# Patient Record
Sex: Male | Born: 1955 | Race: White | Hispanic: No | Marital: Married | Smoking: Former smoker
Health system: Southern US, Community
[De-identification: ages and names within clinical notes are randomized; demographics above are authoritative.]

## PROBLEM LIST (undated history)

## (undated) DIAGNOSIS — I779 Disorder of arteries and arterioles, unspecified: Secondary | ICD-10-CM

## (undated) DIAGNOSIS — M1990 Unspecified osteoarthritis, unspecified site: Secondary | ICD-10-CM

## (undated) DIAGNOSIS — I10 Essential (primary) hypertension: Secondary | ICD-10-CM

## (undated) DIAGNOSIS — H9313 Tinnitus, bilateral: Secondary | ICD-10-CM

## (undated) DIAGNOSIS — M5412 Radiculopathy, cervical region: Secondary | ICD-10-CM

## (undated) DIAGNOSIS — G4733 Obstructive sleep apnea (adult) (pediatric): Secondary | ICD-10-CM

## (undated) DIAGNOSIS — I714 Abdominal aortic aneurysm, without rupture, unspecified: Secondary | ICD-10-CM

## (undated) DIAGNOSIS — I739 Peripheral vascular disease, unspecified: Secondary | ICD-10-CM

## (undated) DIAGNOSIS — G4730 Sleep apnea, unspecified: Secondary | ICD-10-CM

## (undated) DIAGNOSIS — E119 Type 2 diabetes mellitus without complications: Secondary | ICD-10-CM

## (undated) DIAGNOSIS — F32A Depression, unspecified: Secondary | ICD-10-CM

## (undated) DIAGNOSIS — K219 Gastro-esophageal reflux disease without esophagitis: Secondary | ICD-10-CM

## (undated) DIAGNOSIS — R7309 Other abnormal glucose: Secondary | ICD-10-CM

## (undated) DIAGNOSIS — G4700 Insomnia, unspecified: Secondary | ICD-10-CM

## (undated) DIAGNOSIS — I671 Cerebral aneurysm, nonruptured: Secondary | ICD-10-CM

## (undated) HISTORY — PX: SPINE SURGERY: SHX786

## (undated) HISTORY — DX: Insomnia, unspecified: G47.00

## (undated) HISTORY — PX: STENT PLACEMENT ILIAC (ARMC HX): HXRAD1735

## (undated) HISTORY — DX: Tinnitus, bilateral: H93.13

## (undated) HISTORY — DX: Peripheral vascular disease, unspecified: I73.9

## (undated) HISTORY — DX: Radiculopathy, cervical region: M54.12

## (undated) HISTORY — DX: Other abnormal glucose: R73.09

## (undated) HISTORY — PX: BACK SURGERY: SHX140

## (undated) HISTORY — DX: Cerebral aneurysm, nonruptured: I67.1

## (undated) HISTORY — DX: Depression, unspecified: F32.A

## (undated) HISTORY — DX: Disorder of arteries and arterioles, unspecified: I77.9

## (undated) HISTORY — DX: Sleep apnea, unspecified: G47.30

## (undated) HISTORY — DX: Obstructive sleep apnea (adult) (pediatric): G47.33

## (undated) HISTORY — DX: Abdominal aortic aneurysm, without rupture, unspecified: I71.40

## (undated) HISTORY — DX: Essential (primary) hypertension: I10

---

## 2020-10-06 ENCOUNTER — Other Ambulatory Visit: Payer: Self-pay

## 2020-10-06 DIAGNOSIS — I739 Peripheral vascular disease, unspecified: Secondary | ICD-10-CM

## 2020-10-07 ENCOUNTER — Encounter: Payer: Self-pay | Admitting: *Deleted

## 2020-10-23 ENCOUNTER — Ambulatory Visit (HOSPITAL_COMMUNITY): Payer: PRIVATE HEALTH INSURANCE

## 2020-11-02 ENCOUNTER — Other Ambulatory Visit: Payer: Self-pay

## 2020-11-02 ENCOUNTER — Ambulatory Visit (INDEPENDENT_AMBULATORY_CARE_PROVIDER_SITE_OTHER): Payer: PRIVATE HEALTH INSURANCE | Admitting: Vascular Surgery

## 2020-11-02 ENCOUNTER — Encounter: Payer: Self-pay | Admitting: Vascular Surgery

## 2020-11-02 VITALS — BP 144/72 | HR 80 | Temp 98.8°F | Resp 16 | Ht 74.0 in | Wt 296.0 lb

## 2020-11-02 DIAGNOSIS — I739 Peripheral vascular disease, unspecified: Secondary | ICD-10-CM | POA: Diagnosis not present

## 2020-11-02 NOTE — Progress Notes (Signed)
[facility] [place]  Patient name: Thomas Lindsey MRN: [id number] DOB: 06/17/1956 Sex: male  REASON FOR CONSULT: Establish care for peripheral vascular disease in this area  HPI: Thomas Lindsey is a 64 y.o. male, here today for establishment of vascular care.  He is here with his wife.  He recently moved here from [place].  He has prior history of small abdominal aortic aneurysm and has had follow-up with serial ultrasounds.  Also has history of peripheral vascular occlusive disease with a remote history of iliac angioplasty and stenting.  I do not have access to any of these records.  These have been requested to be sent to [place].  He is having recurrent claudication symptoms.  This is very typical calf claudication which she is able to be relieved with rest.  He also has a history of degenerative disc disease with prior back surgery.  He recently was working in an attic space and had back pain and pain radiating down his left leg.  This does sound to be much more neurogenic than peripheral vascular pain.  He has no history of tissue loss  Past Medical History:  Diagnosis Date  . Aortic disorder (HCC)   . Cervical radiculitis   . Depression   . Elevated glucose   . Hypertension   . Insomnia   . Obstructive sleep apnea   . Peripheral arterial disease (HCC)   . Saccular aneurysm   . Tinnitus of both ears     Family History  Problem Relation Age of Onset  . Heart disease Father   . Hypertension Father   . Hyperlipidemia Father   . Kidney disease Father     SOCIAL HISTORY: Social History   Socioeconomic History  . Marital status: Unknown    Spouse name: Not on file  . Number of children: Not on file  . Years of education: Not on file  . Highest education level: Not on file  Occupational History  . Not on file  Tobacco Use  . Smoking status: Former Smoker    Quit date: 06/14/2007    Years since quitting: 13.3  . Smokeless tobacco: Never Used    Substance and Sexual Activity  . Alcohol use: Yes    Comment: Occasional  . Drug use: Not Currently  . Sexual activity: Not on file  Other Topics Concern  . Not on file  Social History Narrative  . Not on file   Social Determinants of Health   Financial Resource Strain:   . Difficulty of Paying Living Expenses: Not on file  Food Insecurity:   . Worried About Programme researcher, broadcasting/film/video in the Last Year: Not on file  . Ran Out of Food in the Last Year: Not on file  Transportation Needs:   . Lack of Transportation (Medical): Not on file  . Lack of Transportation (Non-Medical): Not on file  Physical Activity:   . Days of Exercise per Week: Not on file  . Minutes of Exercise per Session: Not on file  Stress:   . Feeling of Stress : Not on file  Social Connections:   . Frequency of Communication with Friends and Family: Not on file  . Frequency of Social Gatherings with Friends and Family: Not on file  . Attends Religious Services: Not on file  . Active Member of Clubs or Organizations: Not on file  . Attends Banker Meetings: Not on file  . Marital Status: Not on  file  Intimate Partner Violence:   . Fear of Current or Ex-Partner: Not on file  . Emotionally Abused: Not on file  . Physically Abused: Not on file  . Sexually Abused: Not on file    Allergies  Allergen Reactions  . Hctz [Hydrochlorothiazide] Other (See Comments)    Dizzy    Current Outpatient Medications  Medication Sig Dispense Refill  . amLODipine (NORVASC) 10 MG tablet Take 10 mg by mouth daily.    Marland Kitchen atorvastatin (LIPITOR) 40 MG tablet Take 40 mg by mouth daily.    . clopidogrel (PLAVIX) 75 MG tablet Take 75 mg by mouth daily.    . cyclobenzaprine (FLEXERIL) 5 MG tablet Take 5 mg by mouth 2 (two) times daily as needed.    . metFORMIN (GLUCOPHAGE) 500 MG tablet Take 500 mg by mouth daily.    . metoprolol succinate (TOPROL-XL) 50 MG 24 hr tablet Take 50 mg by mouth 2 (two) times daily.    .  pantoprazole (PROTONIX) 40 MG tablet Take 40 mg by mouth daily.    Marland Kitchen spironolactone (ALDACTONE) 50 MG tablet Take 50 mg by mouth 2 (two) times daily.    . Vitamin D, Ergocalciferol, (DRISDOL) 1.25 MG (50000 UNIT) CAPS capsule Take 50,000 Units by mouth once a week.    . zolpidem (AMBIEN) 10 MG tablet Take 10 mg by mouth at bedtime as needed.     No current facility-administered medications for this visit.    REVIEW OF SYSTEMS:  [X]  denotes positive finding, [ ]  denotes negative finding Cardiac  Comments:  Chest pain or chest pressure:    Shortness of breath upon exertion: x   Short of breath when lying flat:    Irregular heart rhythm:        Vascular    Pain in calf, thigh, or hip brought on by ambulation: x   Pain in feet at night that wakes you up from your sleep:     Blood clot in your veins:    Leg swelling:         Pulmonary    Oxygen at home:    Productive cough:     Wheezing:         Neurologic    Sudden weakness in arms or legs:     Sudden numbness in arms or legs:     Sudden onset of difficulty speaking or slurred speech:    Temporary loss of vision in one eye:     Problems with dizziness:         Gastrointestinal    Blood in stool:     Vomited blood:         Genitourinary    Burning when urinating:     Blood in urine:        Psychiatric    Major depression:         Hematologic    Bleeding problems:    Problems with blood clotting too easily:        Skin    Rashes or ulcers:        Constitutional    Fever or chills:      PHYSICAL EXAM: Vitals:   11/02/20 0926  BP: (!) 144/72  Pulse: 80  Resp: 16  Temp: 98.8 F (37.1 C)  TempSrc: Other (Comment)  SpO2: 95%  Weight: 296 lb (134.3 kg)  Height: 6\' 2"  (1.88 m)    GENERAL: The patient is a well-nourished male, in no acute distress. The vital  signs are documented above. VASCULAR: Carotid arteries without bruits bilaterally.  2+ radial pulses bilaterally.  I do not palpate an aneurysm.  He does  have abdominal obesity.  He does have palpable femoral pulses and 2+ posterior tibial pulses bilaterally PULMONARY: There is good air exchange ABDOMEN: Soft and non-tender  MUSCULOSKELETAL: There are no major deformities or cyanosis. NEUROLOGIC: No focal weakness or paresthesias are detected. SKIN: There are no ulcers or rashes noted. PSYCHIATRIC: The patient has a normal affect.  DATA:   Pending  MEDICAL ISSUES:  Had long discussion with the patient.  He does report a remote history and it very Swati Granberry age of bilateral iliac stenting.  He reports that he has had stable exams in the past with noninvasive studies.  He reports that his abdominal aortic aneurysm was very small and unchanged.  I explained that he certainly does not have any evidence of limb threatening ischemia with easily palpable posterior tibial pulses.  He is quite active and is quite limited by his claudication symptoms.  I explained that in all likelihood this is related to recurrent disease in his iliac systems and there is a very high likelihood that this could be corrected with reintervention.  I would recommend this only if he is severely limited and he reveals that he is.  I have recommended that we obtain noninvasive studies with ankle arm index with a exercise study.  If this does show abnormal response with a drop in his ankle arm index, I would recommend arteriography for further evaluation and probable intervention.  We will schedule his noninvasive studies and communicate with him further following this.   Larina Earthly, MD FACS [facility] [place] Office phone [phone]

## 2020-11-04 ENCOUNTER — Other Ambulatory Visit: Payer: Self-pay | Admitting: *Deleted

## 2020-11-04 DIAGNOSIS — I739 Peripheral vascular disease, unspecified: Secondary | ICD-10-CM

## 2020-11-12 ENCOUNTER — Ambulatory Visit (HOSPITAL_COMMUNITY): Admission: RE | Admit: 2020-11-12 | Payer: PRIVATE HEALTH INSURANCE | Source: Ambulatory Visit

## 2020-11-13 ENCOUNTER — Ambulatory Visit (HOSPITAL_COMMUNITY): Admission: RE | Admit: 2020-11-13 | Payer: PRIVATE HEALTH INSURANCE | Source: Ambulatory Visit

## 2020-11-13 ENCOUNTER — Other Ambulatory Visit: Payer: Self-pay

## 2020-11-16 ENCOUNTER — Other Ambulatory Visit: Payer: Self-pay

## 2020-11-16 ENCOUNTER — Ambulatory Visit (HOSPITAL_COMMUNITY)
Admission: RE | Admit: 2020-11-16 | Discharge: 2020-11-16 | Disposition: A | Discharge status: Home / Self Care | Payer: PRIVATE HEALTH INSURANCE | Source: Ambulatory Visit | Attending: Vascular Surgery | Admitting: Vascular Surgery

## 2020-11-16 ENCOUNTER — Telehealth: Payer: Self-pay | Admitting: Vascular Surgery

## 2020-11-16 DIAGNOSIS — I739 Peripheral vascular disease, unspecified: Secondary | ICD-10-CM | POA: Insufficient documentation

## 2020-11-16 NOTE — Telephone Encounter (Signed)
I called the patient to let him know of his exercise ankle arm index.  He had normal resting studies and normal response to exercise.  I explained that this essentially rules out any arterial insufficiency as a cause for his leg symptoms.  He will follow up with his medical doctor.    I have not received records from his vascular surgeon in [place] and will check again on this.  I will see him in 1 year with ultrasound of his abdominal aortic aneurysm, carotid duplex for follow-up of moderate disease and ABI.

## 2020-11-23 ENCOUNTER — Ambulatory Visit: Payer: PRIVATE HEALTH INSURANCE | Admitting: Vascular Surgery

## 2021-05-25 ENCOUNTER — Ambulatory Visit (HOSPITAL_COMMUNITY)
Admission: RE | Admit: 2021-05-25 | Discharge: 2021-05-25 | Disposition: A | Discharge status: Home / Self Care | Payer: Medicare Other | Source: Ambulatory Visit | Attending: Adult Health | Admitting: Adult Health

## 2021-05-25 ENCOUNTER — Other Ambulatory Visit (HOSPITAL_COMMUNITY): Payer: Self-pay | Admitting: Adult Health

## 2021-05-25 DIAGNOSIS — M545 Low back pain, unspecified: Secondary | ICD-10-CM | POA: Insufficient documentation

## 2021-06-10 ENCOUNTER — Other Ambulatory Visit (HOSPITAL_COMMUNITY): Payer: Self-pay | Admitting: Orthopedic Surgery

## 2021-06-10 DIAGNOSIS — M545 Low back pain, unspecified: Secondary | ICD-10-CM

## 2021-06-17 ENCOUNTER — Other Ambulatory Visit: Payer: Self-pay

## 2021-06-17 ENCOUNTER — Ambulatory Visit (HOSPITAL_COMMUNITY)
Admission: RE | Admit: 2021-06-17 | Discharge: 2021-06-17 | Disposition: A | Discharge status: Home / Self Care | Payer: Medicare Other | Source: Ambulatory Visit | Attending: Orthopedic Surgery | Admitting: Orthopedic Surgery

## 2021-06-17 DIAGNOSIS — M545 Low back pain, unspecified: Secondary | ICD-10-CM | POA: Diagnosis present

## 2021-06-17 MED ORDER — GADOBUTROL 1 MMOL/ML IV SOLN
10.0000 mL | Freq: Once | INTRAVENOUS | Status: AC | PRN
  Administered 2021-06-17: 10 mL via INTRAVENOUS

## 2021-07-06 ENCOUNTER — Other Ambulatory Visit: Payer: Self-pay | Admitting: Orthopedic Surgery

## 2021-07-28 NOTE — Progress Notes (Signed)
Surgical Instructions    Your procedure is scheduled on Friday September 2nd.  Report to [facility] Main Entrance "A" at 5:30 A.M., then check in with the Admitting office.  Call this number if you have problems the morning of surgery:  [phone]   If you have any questions prior to your surgery date call [phone]: Open Monday-Friday 8am-4pm    Remember:  Do not eat or drink anything after midnight the night before your surgery     Take these medicines the morning of surgery with A SIP OF WATER  amLODipine (NORVASC) 10 MG tablet atorvastatin (LIPITOR) 40 MG tablet metoprolol succinate (TOPROL-XL) 50 MG 24 hr tablet pantoprazole (PROTONIX) 40 MG tablet   IF NEEDED cyclobenzaprine (FLEXERIL) 5 MG tablet  Follow your surgeon's instructions on when to stop Plavix.  If no instructions were given by your surgeon then you will need to call the office to get those instructions.    As of today, STOP taking any Aspirin (unless otherwise instructed by your surgeon) VOLTAREN, Aleve, Naproxen, Ibuprofen, Motrin, Advil, Goody's, BC's, all herbal medications, fish oil, and all vitamins.    WHAT DO I DO ABOUT MY DIABETES MEDICATION?   Do not take oral diabetes medicines metFORMIN  the morning of surgery.   HOW TO MANAGE YOUR DIABETES BEFORE AND AFTER SURGERY  Why is it important to control my blood sugar before and after surgery? Improving blood sugar levels before and after surgery helps healing and can limit problems. A way of improving blood sugar control is eating a healthy diet by:  Eating less sugar and carbohydrates  Increasing activity/exercise  Talking with your doctor about reaching your blood sugar goals High blood sugars (greater than 180 mg/dL) can raise your risk of infections and slow your recovery, so you will need to focus on controlling your diabetes during the weeks before surgery. Make sure that the doctor who takes care of your diabetes knows about your  planned surgery including the date and location.  How do I manage my blood sugar before surgery? Check your blood sugar at least 4 times a day, starting 2 days before surgery, to make sure that the level is not too high or low.  Check your blood sugar the morning of your surgery when you wake up and every 2 hours until you get to the Short Stay unit.  If your blood sugar is less than 70 mg/dL, you will need to treat for low blood sugar: Do not take insulin. Treat a low blood sugar (less than 70 mg/dL) with  cup of clear juice (cranberry or apple), 4 glucose tablets, OR glucose gel. Recheck blood sugar in 15 minutes after treatment (to make sure it is greater than 70 mg/dL). If your blood sugar is not greater than 70 mg/dL on recheck, call [phone] for further instructions. Report your blood sugar to the short stay nurse when you get to Short Stay.  If you are admitted to the hospital after surgery: Your blood sugar will be checked by the staff and you will probably be given insulin after surgery (instead of oral diabetes medicines) to make sure you have good blood sugar levels. The goal for blood sugar control after surgery is 80-180 mg/dL.         Do not wear jewelry  Do not wear lotions, powders, colognes, or deodorant. Do not shave 48 hours prior to surgery.  Men may shave face and neck. Do not bring valuables to the hospital. DO Not  wear nail polish, gel polish, artificial nails, or any other type of covering on  natural nails including finger and toenails. If patients have artificial nails, gel coating, etc. that need to be removed by a nail salon please have this removed prior to surgery or surgery may need to be canceled/delayed if the surgeon/ anesthesia feels like the patient is unable to be adequately monitored.             [facility] is not responsible for any belongings or valuables.  Do NOT Smoke (Tobacco/Vaping) or drink Alcohol 24 hours prior to your procedure If you  use a CPAP at night, you may bring all equipment for your overnight stay.   Contacts, glasses, dentures or bridgework may not be worn into surgery, please bring cases for these belongings   For patients admitted to the hospital, discharge time will be determined by your treatment team.   Patients discharged the day of surgery will not be allowed to drive home, and someone needs to stay with them for 24 hours.  ONLY 1 SUPPORT PERSON MAY BE PRESENT WHILE YOU ARE IN SURGERY. IF YOU ARE TO BE ADMITTED ONCE YOU ARE IN YOUR ROOM YOU WILL BE ALLOWED TWO (2) VISITORS.  Minor children may have two parents present. Special consideration for safety and communication needs will be reviewed on a case by case basis.  Special instructions:    Oral Hygiene is also important to reduce your risk of infection.  Remember - BRUSH YOUR TEETH THE MORNING OF SURGERY WITH YOUR REGULAR TOOTHPASTE   [facility]- Preparing For Surgery  Before surgery, you can play an important role. Because skin is not sterile, your skin needs to be as free of germs as possible. You can reduce the number of germs on your skin by washing with CHG (chlorahexidine gluconate) Soap before surgery.  CHG is an antiseptic cleaner which kills germs and bonds with the skin to continue killing germs even after washing.     Please do not use if you have an allergy to CHG or antibacterial soaps. If your skin becomes reddened/irritated stop using the CHG.  Do not shave (including legs and underarms) for at least 48 hours prior to first CHG shower. It is OK to shave your face.  Please follow these instructions carefully.     Shower the NIGHT BEFORE SURGERY and the MORNING OF SURGERY with CHG Soap.   If you chose to wash your hair, wash your hair first as usual with your normal shampoo. After you shampoo, rinse your hair and body thoroughly to remove the shampoo.  Then [employer] and genitals (private parts) with your normal soap and rinse thoroughly  to remove soap.  After that Use CHG Soap as you would any other liquid soap. You can apply CHG directly to the skin and wash gently with a scrungie or a clean washcloth.   Apply the CHG Soap to your body ONLY FROM THE NECK DOWN.  Do not use on open wounds or open sores. Avoid contact with your eyes, ears, mouth and genitals (private parts). Wash Face and genitals (private parts)  with your normal soap.   Wash thoroughly, paying special attention to the area where your surgery will be performed.  Thoroughly rinse your body with warm water from the neck down.  DO NOT shower/wash with your normal soap after using and rinsing off the CHG Soap.  Pat yourself dry with a CLEAN TOWEL.  Wear CLEAN PAJAMAS to bed  the night before surgery  Place CLEAN SHEETS on your bed the night before your surgery  DO NOT SLEEP WITH PETS.   Day of Surgery:  Take a shower with CHG soap. Wear Clean/Comfortable clothing the morning of surgery Do not apply any deodorants/lotions.   Remember to brush your teeth WITH YOUR REGULAR TOOTHPASTE.   Please read over the following fact sheets that you were given.

## 2021-07-29 ENCOUNTER — Encounter (HOSPITAL_COMMUNITY)
Admission: RE | Admit: 2021-07-29 | Discharge: 2021-07-29 | Disposition: A | Discharge status: Home / Self Care | Payer: Medicare Other | Source: Ambulatory Visit | Attending: Orthopedic Surgery | Admitting: Orthopedic Surgery

## 2021-07-29 ENCOUNTER — Other Ambulatory Visit: Payer: Self-pay

## 2021-07-29 ENCOUNTER — Encounter (HOSPITAL_COMMUNITY): Payer: Self-pay

## 2021-07-29 DIAGNOSIS — Z01812 Encounter for preprocedural laboratory examination: Secondary | ICD-10-CM | POA: Insufficient documentation

## 2021-07-29 DIAGNOSIS — M5416 Radiculopathy, lumbar region: Secondary | ICD-10-CM | POA: Diagnosis not present

## 2021-07-29 HISTORY — DX: Type 2 diabetes mellitus without complications: E11.9

## 2021-07-29 HISTORY — DX: Gastro-esophageal reflux disease without esophagitis: K21.9

## 2021-07-29 HISTORY — DX: Unspecified osteoarthritis, unspecified site: M19.90

## 2021-07-29 LAB — URINALYSIS, ROUTINE W REFLEX MICROSCOPIC
Bilirubin Urine: NEGATIVE
Glucose, UA: NEGATIVE mg/dL
Hgb urine dipstick: NEGATIVE
Ketones, ur: NEGATIVE mg/dL
Leukocytes,Ua: NEGATIVE
Nitrite: NEGATIVE
Protein, ur: NEGATIVE mg/dL
Specific Gravity, Urine: 1.018 (ref 1.005–1.030)
pH: 5 (ref 5.0–8.0)

## 2021-07-29 LAB — CBC WITH DIFFERENTIAL/PLATELET
Abs Immature Granulocytes: 0.05 10*3/uL (ref 0.00–0.07)
Basophils Absolute: 0 10*3/uL (ref 0.0–0.1)
Basophils Relative: 1 %
Eosinophils Absolute: 0.1 10*3/uL (ref 0.0–0.5)
Eosinophils Relative: 1 %
HCT: 41.2 % (ref 39.0–52.0)
Hemoglobin: 13.8 g/dL (ref 13.0–17.0)
Immature Granulocytes: 1 %
Lymphocytes Relative: 31 %
Lymphs Abs: 2.6 10*3/uL (ref 0.7–4.0)
MCH: 30.7 pg (ref 26.0–34.0)
MCHC: 33.5 g/dL (ref 30.0–36.0)
MCV: 91.6 fL (ref 80.0–100.0)
Monocytes Absolute: 0.5 10*3/uL (ref 0.1–1.0)
Monocytes Relative: 6 %
Neutro Abs: 5 10*3/uL (ref 1.7–7.7)
Neutrophils Relative %: 60 %
Platelets: 326 10*3/uL (ref 150–400)
RBC: 4.5 MIL/uL (ref 4.22–5.81)
RDW: 13 % (ref 11.5–15.5)
WBC: 8.3 10*3/uL (ref 4.0–10.5)
nRBC: 0 % (ref 0.0–0.2)

## 2021-07-29 LAB — HEMOGLOBIN A1C
Hgb A1c MFr Bld: 6.7 % — ABNORMAL HIGH (ref 4.8–5.6)
Mean Plasma Glucose: 145.59 mg/dL

## 2021-07-29 LAB — COMPREHENSIVE METABOLIC PANEL
ALT: 37 U/L (ref 0–44)
AST: 24 U/L (ref 15–41)
Albumin: 4.2 g/dL (ref 3.5–5.0)
Alkaline Phosphatase: 48 U/L (ref 38–126)
Anion gap: 7 (ref 5–15)
BUN: 17 mg/dL (ref 8–23)
CO2: 27 mmol/L (ref 22–32)
Calcium: 9.6 mg/dL (ref 8.9–10.3)
Chloride: 102 mmol/L (ref 98–111)
Creatinine, Ser: 0.89 mg/dL (ref 0.61–1.24)
GFR, Estimated: >60 mL/min (ref >60)
Glucose, Bld: 124 mg/dL — ABNORMAL HIGH (ref 70–99)
Potassium: 4.7 mmol/L (ref 3.5–5.1)
Sodium: 136 mmol/L (ref 135–145)
Total Bilirubin: 0.8 mg/dL (ref 0.3–1.2)
Total Protein: 7.2 g/dL (ref 6.5–8.1)

## 2021-07-29 LAB — GLUCOSE, CAPILLARY: Glucose-Capillary: 126 mg/dL — ABNORMAL HIGH (ref 70–99)

## 2021-07-29 LAB — SURGICAL PCR SCREEN
MRSA, PCR: NEGATIVE
Staphylococcus aureus: NEGATIVE

## 2021-07-29 LAB — APTT: aPTT: 27 seconds (ref 24–36)

## 2021-07-29 LAB — TYPE AND SCREEN
ABO/RH(D): A POS
Antibody Screen: NEGATIVE

## 2021-07-29 LAB — PROTIME-INR
INR: 1 (ref 0.8–1.2)
Prothrombin Time: 12.7 seconds (ref 11.4–15.2)

## 2021-07-29 NOTE — Progress Notes (Signed)
PCP - [facility], [place] Cardiologist - none  PPM/ICD - denies Device Orders -  Rep Notified -   Chest x-ray -  EKG - 07/29/21 Stress Test - none ECHO - none Cardiac Cath - none  Sleep Study - yes CPAP - yes  Fasting Blood Sugar - 110's-130's Checks Blood Sugar once a day  Blood Thinner Instructions: pt states he was instructed by surgeon to stop Plavix and Aspirin 7 days prior to surgery.  Aspirin Instructions:see above  ERAS Protcol -yes PRE-SURGERY Ensure or G2- water  COVID TEST- pt will have to be tested DOS since testing center will be closed Monday 08/09/21 for Labor Day holiday.   Anesthesia review: no  Patient denies shortness of breath, fever, cough and chest pain at PAT appointment   All instructions explained to the patient, with a verbal understanding of the material. Patient agrees to go over the instructions while at home for a better understanding. Patient also instructed to self quarantine after being tested for COVID-19. The opportunity to ask questions was provided.

## 2021-07-29 NOTE — Progress Notes (Signed)
Surgical Instructions    Your procedure is scheduled on Wednesday September 7.  Report to [facility] Main Entrance "A" at 0950 A.M., then check in with the Admitting office.  Call this number if you have problems the morning of surgery:  [phone]   If you have any questions prior to your surgery date call [phone]: Open Monday-Friday 8am-4pm    Remember:  The night before surgery:  No food after midnight. ONLY clear liquids after midnight.    The day of surgery (if you have diabetes): Drink ONE (1) 8oz bottle of water given to you in your pre admission testing appointment by 0950 the morning of surgery. Drink in one sitting. Do not sip.  This drink was given to you during your hospital  pre-op appointment visit.  Nothing else to drink after completing the  8 oz bottle of water         If you have questions, please contact your surgeon's office.       Take these medicines the morning of surgery with A SIP OF WATER  amLODipine (NORVASC) 10 MG tablet atorvastatin (LIPITOR) 40 MG tablet metoprolol succinate (TOPROL-XL) 50 MG 24 hr tablet pantoprazole (PROTONIX) 40 MG tablet   IF NEEDED cyclobenzaprine (FLEXERIL) 5 MG tablet  Follow your surgeon's instructions on when to stop Plavix.  If no instructions were given by your surgeon then you will need to call the office to get those instructions.    As of today, STOP taking any Aspirin (unless otherwise instructed by your surgeon) VOLTAREN, Aleve, Naproxen, Ibuprofen, Motrin, Advil, Goody's, BC's, all herbal medications, fish oil, and all vitamins.    WHAT DO I DO ABOUT MY DIABETES MEDICATION?   Do not take oral diabetes medicines metFORMIN  the morning of surgery.   HOW TO MANAGE YOUR DIABETES BEFORE AND AFTER SURGERY  Why is it important to control my blood sugar before and after surgery? Improving blood sugar levels before and after surgery helps healing and can limit problems. A way of improving blood sugar  control is eating a healthy diet by:  Eating less sugar and carbohydrates  Increasing activity/exercise  Talking with your doctor about reaching your blood sugar goals High blood sugars (greater than 180 mg/dL) can raise your risk of infections and slow your recovery, so you will need to focus on controlling your diabetes during the weeks before surgery. Make sure that the doctor who takes care of your diabetes knows about your planned surgery including the date and location.  How do I manage my blood sugar before surgery? Check your blood sugar at least 4 times a day, starting 2 days before surgery, to make sure that the level is not too high or low.  Check your blood sugar the morning of your surgery when you wake up and every 2 hours until you get to the Short Stay unit.  If your blood sugar is less than 70 mg/dL, you will need to treat for low blood sugar: Do not take insulin. Treat a low blood sugar (less than 70 mg/dL) with  cup of clear juice (cranberry or apple), 4 glucose tablets, OR glucose gel. Recheck blood sugar in 15 minutes after treatment (to make sure it is greater than 70 mg/dL). If your blood sugar is not greater than 70 mg/dL on recheck, call [phone] for further instructions. Report your blood sugar to the short stay nurse when you get to Short Stay.  If you are admitted to the hospital after surgery:  Your blood sugar will be checked by the staff and you will probably be given insulin after surgery (instead of oral diabetes medicines) to make sure you have good blood sugar levels. The goal for blood sugar control after surgery is 80-180 mg/dL.         Do not wear jewelry  Do not wear lotions, powders, colognes, or deodorant. Do not shave 48 hours prior to surgery.  Men may shave face and neck. Do not bring valuables to the hospital. DO Not wear nail polish, gel polish, artificial nails, or any other type of covering on  natural nails including finger and  toenails. If patients have artificial nails, gel coating, etc. that need to be removed by a nail salon please have this removed prior to surgery or surgery may need to be canceled/delayed if the surgeon/ anesthesia feels like the patient is unable to be adequately monitored.             [facility] is not responsible for any belongings or valuables.  Do NOT Smoke (Tobacco/Vaping) or drink Alcohol 24 hours prior to your procedure If you use a CPAP at night, you may bring all equipment for your overnight stay.   Contacts, glasses, dentures or bridgework may not be worn into surgery, please bring cases for these belongings   For patients admitted to the hospital, discharge time will be determined by your treatment team.   Patients discharged the day of surgery will not be allowed to drive home, and someone needs to stay with them for 24 hours.  ONLY 1 SUPPORT PERSON MAY BE PRESENT WHILE YOU ARE IN SURGERY. IF YOU ARE TO BE ADMITTED ONCE YOU ARE IN YOUR ROOM YOU WILL BE ALLOWED TWO (2) VISITORS.  Minor children may have two parents present. Special consideration for safety and communication needs will be reviewed on a case by case basis.  Special instructions:    Oral Hygiene is also important to reduce your risk of infection.  Remember - BRUSH YOUR TEETH THE MORNING OF SURGERY WITH YOUR REGULAR TOOTHPASTE   [facility]- Preparing For Surgery  Before surgery, you can play an important role. Because skin is not sterile, your skin needs to be as free of germs as possible. You can reduce the number of germs on your skin by washing with CHG (chlorahexidine gluconate) Soap before surgery.  CHG is an antiseptic cleaner which kills germs and bonds with the skin to continue killing germs even after washing.     Please do not use if you have an allergy to CHG or antibacterial soaps. If your skin becomes reddened/irritated stop using the CHG.  Do not shave (including legs and underarms) for at least 48  hours prior to first CHG shower. It is OK to shave your face.  Please follow these instructions carefully.     Shower the NIGHT BEFORE SURGERY and the MORNING OF SURGERY with CHG Soap.   If you chose to wash your hair, wash your hair first as usual with your normal shampoo. After you shampoo, rinse your hair and body thoroughly to remove the shampoo.  Then [employer] and genitals (private parts) with your normal soap and rinse thoroughly to remove soap.  After that Use CHG Soap as you would any other liquid soap. You can apply CHG directly to the skin and wash gently with a scrungie or a clean washcloth.   Apply the CHG Soap to your body ONLY FROM THE NECK DOWN.  Do  not use on open wounds or open sores. Avoid contact with your eyes, ears, mouth and genitals (private parts). Wash Face and genitals (private parts)  with your normal soap.   Wash thoroughly, paying special attention to the area where your surgery will be performed.  Thoroughly rinse your body with warm water from the neck down.  DO NOT shower/wash with your normal soap after using and rinsing off the CHG Soap.  Pat yourself dry with a CLEAN TOWEL.  Wear CLEAN PAJAMAS to bed the night before surgery  Place CLEAN SHEETS on your bed the night before your surgery  DO NOT SLEEP WITH PETS.   Day of Surgery:  Take a shower with CHG soap. Wear Clean/Comfortable clothing the morning of surgery Do not apply any deodorants/lotions.   Remember to brush your teeth WITH YOUR REGULAR TOOTHPASTE.   Please read over the following fact sheets that you were given.

## 2021-08-06 ENCOUNTER — Other Ambulatory Visit: Payer: Self-pay | Admitting: Orthopedic Surgery

## 2021-08-07 LAB — SARS CORONAVIRUS 2 (TAT 6-24 HRS): SARS Coronavirus 2: NEGATIVE

## 2021-08-10 ENCOUNTER — Encounter (HOSPITAL_COMMUNITY): Payer: Self-pay | Admitting: Physician Assistant

## 2021-08-10 ENCOUNTER — Encounter (HOSPITAL_COMMUNITY): Payer: Self-pay | Admitting: Certified Registered Nurse Anesthetist

## 2021-08-10 NOTE — Progress Notes (Signed)
Anesthesia Chart Review:  Follows with vascular surgery for PVD with history of remote bilateral iliac stenting and small abdominal aortic aneurysm (per Dr. Bosie Helper notes).  He is maintained on Plavix.  He recently moved to [place] from [place] and established vascular care with Dr. Arbie Cookey in November 2021 and was reporting some claudication symptoms.  Dr. Arbie Cookey ordered lower extremity exercise ankle index.  He commented on the results in telephone encounter 11/16/2020 stating, "I called the patient to let him know of his exercise ankle arm index.  He had normal resting studies and normal response to exercise.  I explained that this essentially rules out any arterial insufficiency as a cause for his leg symptoms.  He will follow up with his medical doctor. I have not received records from his vascular surgeon in [place] and will check again on this.  I will see him in 1 year with ultrasound of his abdominal aortic aneurysm, carotid duplex for follow-up of moderate disease and ABI."  Patient has preop clearance from PCP stating he is moderate risk for surgery and may hold Plavix for 7 days prior.  Copy on chart.  OSA on CPAP.  DM2 well-controlled, A1c 6.7 on 07/29/2021.  Remainder of preop labs unremarkable.  EKG 07/29/2021: NSR.  Rate 79.   Zannie Cove [facility]/Anesthesiology Phone [phone] 08/10/2021 12:33 PM

## 2021-08-10 NOTE — Anesthesia Preprocedure Evaluation (Deleted)
Anesthesia Evaluation  Patient identified by MRN, date of birth, ID band Patient awake    Reviewed: Allergy & Precautions, NPO status , Patient's Chart, lab work & pertinent test results  Airway Mallampati: II  TM Distance: >3 FB Neck ROM: Full    Dental no notable dental hx. (+) Upper Dentures, Dental Advisory Given   Pulmonary sleep apnea and Continuous Positive Airway Pressure Ventilation , former smoker,    Pulmonary exam normal breath sounds clear to auscultation       Cardiovascular hypertension, Pt. on medications Normal cardiovascular exam Rhythm:Regular Rate:Normal     Neuro/Psych Depression negative neurological ROS     GI/Hepatic Neg liver ROS, GERD  ,  Endo/Other  diabetes, Type 2, Oral Hypoglycemic Agents  Renal/GU Lab Results      Component                Value               Date                      CREATININE               0.89                07/29/2021                BUN                      17                  07/29/2021                NA                       136                 07/29/2021                K                        4.7                 07/29/2021                CL                       102                 07/29/2021                CO2                      27                  07/29/2021                Musculoskeletal  (+) Arthritis ,   Abdominal (+) + obese,   Peds  Hematology Lab Results      Component                Value               Date                      CREATININE  0.89                07/29/2021                BUN                      17                  07/29/2021                NA                       136                 07/29/2021                K                        4.7                 07/29/2021                CL                       102                 07/29/2021                CO2                      27                  07/29/2021               Anesthesia Other Findings   Reproductive/Obstetrics                          Anesthesia Physical Anesthesia Plan  ASA: 3  Anesthesia Plan: General   Post-op Pain Management:    Induction: Intravenous  PONV Risk Score and Plan: 3 and Treatment may vary due to age or medical condition, Midazolam, Dexamethasone and Ondansetron  Airway Management Planned: Oral ETT and Video Laryngoscope Planned  Additional Equipment: None  Intra-op Plan:   Post-operative Plan: Extubation in OR  Informed Consent: I have reviewed the patients History and Physical, chart, labs and discussed the procedure including the risks, benefits and alternatives for the proposed anesthesia with the patient or authorized representative who has indicated his/her understanding and acceptance.     Dental advisory given  Plan Discussed with: CRNA and Anesthesiologist  Anesthesia Plan Comments: (PAT note by Antionette Poles, PA-C: Follows with vascular surgery for PVD with history of remote bilateral iliac stenting and small abdominal aortic aneurysm (per Dr. Bosie Helper notes).  He is maintained on Plavix.  He recently moved to [place] from [place] and established vascular care with Dr. Arbie Cookey in November 2021 and was reporting some claudication symptoms.  Dr. Arbie Cookey ordered lower extremity exercise ankle index.  He commented on the results in telephone encounter 11/16/2020 stating, "I called the patient to let him know of his exercise ankle arm index. He had normal resting studies and normal response to exercise. I explained that this essentially rules out any arterial insufficiency as a cause for his leg symptoms. He will follow up with his medical doctor.I have not received records from  his vascular surgeon in [place] and will check again on this. I will see him in 1 year with ultrasound of his abdominal aortic aneurysm, carotid duplex for follow-up of moderate disease and  ABI."  Patient has preop clearance from PCP stating he is moderate risk for surgery and may hold Plavix for 7 days prior.  Copy on chart.  OSA on CPAP.  DM2 well-controlled, A1c 6.7 on 07/29/2021.  Remainder of preop labs unremarkable.  EKG 07/29/2021: NSR.  Rate 79. )      Anesthesia Quick Evaluation

## 2021-08-11 ENCOUNTER — Encounter (HOSPITAL_COMMUNITY)
Admission: RE | Disposition: A | Discharge status: Home / Self Care | Payer: Self-pay | Source: Home / Self Care | Attending: Orthopedic Surgery

## 2021-08-11 ENCOUNTER — Other Ambulatory Visit: Payer: Self-pay

## 2021-08-11 ENCOUNTER — Ambulatory Visit (HOSPITAL_COMMUNITY)
Admission: RE | Admit: 2021-08-11 | Discharge: 2021-08-11 | Disposition: A | Discharge status: Home / Self Care | Payer: Medicare Other | Attending: Orthopedic Surgery | Admitting: Orthopedic Surgery

## 2021-08-11 ENCOUNTER — Encounter (HOSPITAL_COMMUNITY): Payer: Self-pay | Admitting: Orthopedic Surgery

## 2021-08-11 DIAGNOSIS — Z419 Encounter for procedure for purposes other than remedying health state, unspecified: Secondary | ICD-10-CM

## 2021-08-11 DIAGNOSIS — Z87891 Personal history of nicotine dependence: Secondary | ICD-10-CM | POA: Diagnosis not present

## 2021-08-11 DIAGNOSIS — Z0389 Encounter for observation for other suspected diseases and conditions ruled out: Secondary | ICD-10-CM | POA: Diagnosis not present

## 2021-08-11 DIAGNOSIS — Z5329 Procedure and treatment not carried out because of patient's decision for other reasons: Secondary | ICD-10-CM | POA: Insufficient documentation

## 2021-08-11 DIAGNOSIS — Z7984 Long term (current) use of oral hypoglycemic drugs: Secondary | ICD-10-CM | POA: Diagnosis not present

## 2021-08-11 DIAGNOSIS — Z79899 Other long term (current) drug therapy: Secondary | ICD-10-CM | POA: Diagnosis not present

## 2021-08-11 DIAGNOSIS — Z7902 Long term (current) use of antithrombotics/antiplatelets: Secondary | ICD-10-CM | POA: Diagnosis not present

## 2021-08-11 LAB — ABO/RH: ABO/RH(D): A POS

## 2021-08-11 LAB — GLUCOSE, CAPILLARY
Glucose-Capillary: 153 mg/dL — ABNORMAL HIGH (ref 70–99)
Glucose-Capillary: 137 mg/dL — ABNORMAL HIGH (ref 70–99)

## 2021-08-11 SURGERY — TRANSFORAMINAL LUMBAR INTERBODY FUSION (TLIF) WITH PEDICLE SCREW FIXATION 1 LEVEL
Anesthesia: General | Laterality: Left

## 2021-08-11 MED ORDER — MIDAZOLAM HCL 2 MG/2ML IJ SOLN
INTRAMUSCULAR | Status: AC
  Filled 2021-08-11: qty 2

## 2021-08-11 MED ORDER — ROCURONIUM BROMIDE 10 MG/ML (PF) SYRINGE
PREFILLED_SYRINGE | INTRAVENOUS | Status: AC
  Filled 2021-08-11: qty 10

## 2021-08-11 MED ORDER — POVIDONE-IODINE 7.5 % EX SOLN: Freq: Once | CUTANEOUS | Status: DC

## 2021-08-11 MED ORDER — CHLORHEXIDINE GLUCONATE 0.12 % MT SOLN
15.0000 mL | Freq: Once | OROMUCOSAL | Status: AC
  Administered 2021-08-11: 15 mL via OROMUCOSAL
  Filled 2021-08-11: qty 15

## 2021-08-11 MED ORDER — LABETALOL HCL 5 MG/ML IV SOLN
INTRAVENOUS | Status: AC
  Filled 2021-08-11: qty 4

## 2021-08-11 MED ORDER — LIDOCAINE 2% (20 MG/ML) 5 ML SYRINGE
INTRAMUSCULAR | Status: AC
  Filled 2021-08-11: qty 10

## 2021-08-11 MED ORDER — LACTATED RINGERS IV SOLN: INTRAVENOUS | Status: DC

## 2021-08-11 MED ORDER — DEXAMETHASONE SODIUM PHOSPHATE 10 MG/ML IJ SOLN
INTRAMUSCULAR | Status: AC
  Filled 2021-08-11: qty 2

## 2021-08-11 MED ORDER — SUCCINYLCHOLINE CHLORIDE 200 MG/10ML IV SOSY
PREFILLED_SYRINGE | INTRAVENOUS | Status: AC
  Filled 2021-08-11: qty 10

## 2021-08-11 MED ORDER — FENTANYL CITRATE (PF) 250 MCG/5ML IJ SOLN
INTRAMUSCULAR | Status: AC
  Filled 2021-08-11: qty 5

## 2021-08-11 MED ORDER — ONDANSETRON HCL 4 MG/2ML IJ SOLN
INTRAMUSCULAR | Status: AC
  Filled 2021-08-11: qty 4

## 2021-08-11 MED ORDER — CEFAZOLIN SODIUM-DEXTROSE 2-4 GM/100ML-% IV SOLN
2.0000 g | INTRAVENOUS | Status: DC
  Filled 2021-08-11: qty 100

## 2021-08-11 MED ORDER — ORAL CARE MOUTH RINSE: 15.0000 mL | Freq: Once | OROMUCOSAL | Status: AC

## 2021-08-11 NOTE — H&P (Addendum)
Of note, patient was evaluated in the holding area today, and did note resolution of his preoperative left leg pain.  He states that he has not had pain in the left leg for the last few weeks.  I did express to him that this does very much change the balance between risk of surgery, and benefit of surgery, as he currently does not have any pain in the left leg.  We did talk through pros and cons of proceeding with surgery, versus canceling his surgery, and we did ultimately elect to cancel his surgery.  If he does get recurrence of his pain, he will contact me.         PREOPERATIVE H&P  Chief Complaint: Left leg pain  HPI: Thomas Lindsey is a 65 y.o. 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 Patient is s/p 2 previous decompressions at L4/5  Patient has failed multiple forms of conservative care and continues to have pain (see office notes for additional details regarding the patient's full course of treatment)  Past Medical History:  Diagnosis Date   Aortic disorder (HCC)    Arthritis    Cervical radiculitis    Depression    Diabetes mellitus without complication (HCC)    Elevated glucose    GERD (gastroesophageal reflux disease)    Hypertension    Insomnia    Obstructive sleep apnea    Peripheral arterial disease (HCC)    Saccular aneurysm    Tinnitus of both ears    Past Surgical History:  Procedure Laterality Date   BACK SURGERY     Social History   Socioeconomic History   Marital status: Unknown    Spouse name: Not on file   Number of children: Not on file   Years of education: Not on file   Highest education level: Not on file  Occupational History   Not on file  Tobacco Use   Smoking status: Former    Types: Cigarettes    Quit date: 06/14/2007    Years since quitting: 14.1   Smokeless tobacco: Never  Vaping Use   Vaping Use: Never used  Substance and Sexual Activity   Alcohol use: Yes    Comment:  Occasional   Drug use: Not Currently   Sexual activity: Not on file  Other Topics Concern   Not on file  Social History Narrative   Not on file   Social Determinants of Health   Financial Resource Strain: Not on file  Food Insecurity: Not on file  Transportation Needs: Not on file  Physical Activity: Not on file  Stress: Not on file  Social Connections: Not on file   Family History  Problem Relation Age of Onset   Heart disease Father    Hypertension Father    Hyperlipidemia Father    Kidney disease Father    Allergies  Allergen Reactions   Hctz [Hydrochlorothiazide] Other (See Comments)    Dizzy   Prior to Admission medications   Medication Sig Start Date End Date Taking? Authorizing Provider  amLODipine (NORVASC) 10 MG tablet Take 10 mg by mouth daily. 08/04/20  Yes [provider]  atorvastatin (LIPITOR) 40 MG tablet Take 40 mg by mouth daily. 08/04/20  Yes [provider]  buPROPion [facility] SR) 100 MG 12 hr tablet Take 100 mg by mouth every Monday, Wednesday, and Friday. 04/15/21  Yes [provider]  cholecalciferol (VITAMIN D3) 25 MCG (1000  UNIT) tablet Take 1,000 Units by mouth daily.   Yes [provider]  clopidogrel (PLAVIX) 75 MG tablet Take 75 mg by mouth daily. 08/04/20  Yes [provider]  cyclobenzaprine (FLEXERIL) 5 MG tablet Take 5 mg by mouth 2 (two) times daily as needed for muscle spasms. 10/27/20  Yes [provider]  diclofenac Sodium (VOLTAREN) 1 % GEL Apply 1 application topically daily as needed (pain).   Yes [provider]  metFORMIN (GLUCOPHAGE) 500 MG tablet Take 500 mg by mouth 2 (two) times daily with a meal. 10/20/20  Yes [provider]  metoprolol succinate (TOPROL-XL) 50 MG 24 hr tablet Take 50 mg by mouth 2 (two) times daily. 08/04/20  Yes [provider]  Omega-3 Fatty Acids (FISH OIL) 1000 MG CAPS Take 1,000 mg by mouth daily.   Yes [provider]   pantoprazole (PROTONIX) 40 MG tablet Take 40 mg by mouth daily. 08/04/20  Yes [provider]  spironolactone (ALDACTONE) 50 MG tablet Take 50 mg by mouth 2 (two) times daily. 10/21/20  Yes [provider]  vitamin C (ASCORBIC ACID) 500 MG tablet Take 500 mg by mouth daily.   Yes [provider]  zolpidem (AMBIEN) 10 MG tablet Take 10 mg by mouth at bedtime. 10/07/20  Yes [provider]     All other systems have been reviewed and were otherwise negative with the exception of those mentioned in the HPI and as above.  Physical Exam: There were no vitals filed for this visit.  There is no height or weight on file to calculate BMI.  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

## 2021-08-11 NOTE — Progress Notes (Signed)
Patient's surgery cancelled. Patient has spoken with Dr. Yevette Edwards. Pt's IV d/c'd;belongings returned. Pt walked out to waitng area with patient and he was discharged into care of his wife.

## 2021-11-18 ENCOUNTER — Ambulatory Visit (HOSPITAL_COMMUNITY)
Admission: RE | Admit: 2021-11-18 | Discharge: 2021-11-18 | Disposition: A | Discharge status: Home / Self Care | Payer: Medicare Other | Source: Ambulatory Visit | Attending: Nurse Practitioner | Admitting: Nurse Practitioner

## 2021-11-18 ENCOUNTER — Other Ambulatory Visit: Payer: Self-pay

## 2021-11-18 ENCOUNTER — Other Ambulatory Visit (HOSPITAL_COMMUNITY): Payer: Self-pay | Admitting: Nurse Practitioner

## 2021-11-18 DIAGNOSIS — R059 Cough, unspecified: Secondary | ICD-10-CM | POA: Insufficient documentation

## 2021-12-09 ENCOUNTER — Other Ambulatory Visit: Payer: Self-pay | Admitting: Nurse Practitioner

## 2021-12-09 ENCOUNTER — Other Ambulatory Visit (HOSPITAL_COMMUNITY): Payer: Self-pay | Admitting: Nurse Practitioner

## 2021-12-09 DIAGNOSIS — J984 Other disorders of lung: Secondary | ICD-10-CM

## 2021-12-16 ENCOUNTER — Other Ambulatory Visit: Payer: Self-pay

## 2021-12-16 ENCOUNTER — Ambulatory Visit (HOSPITAL_COMMUNITY)
Admission: RE | Admit: 2021-12-16 | Discharge: 2021-12-16 | Disposition: A | Discharge status: Home / Self Care | Payer: Medicare Other | Source: Ambulatory Visit | Attending: Nurse Practitioner | Admitting: Nurse Practitioner

## 2021-12-16 DIAGNOSIS — J984 Other disorders of lung: Secondary | ICD-10-CM | POA: Insufficient documentation

## 2022-01-10 ENCOUNTER — Other Ambulatory Visit: Payer: Self-pay | Admitting: Nurse Practitioner

## 2022-01-10 ENCOUNTER — Other Ambulatory Visit (HOSPITAL_COMMUNITY): Payer: Self-pay | Admitting: Nurse Practitioner

## 2022-01-10 DIAGNOSIS — I700 Atherosclerosis of aorta: Secondary | ICD-10-CM

## 2022-01-10 DIAGNOSIS — R229 Localized swelling, mass and lump, unspecified: Secondary | ICD-10-CM

## 2022-01-10 DIAGNOSIS — I714 Abdominal aortic aneurysm, without rupture, unspecified: Secondary | ICD-10-CM

## 2022-02-04 ENCOUNTER — Encounter (HOSPITAL_COMMUNITY): Payer: Self-pay

## 2022-02-04 ENCOUNTER — Ambulatory Visit (HOSPITAL_COMMUNITY): Admission: RE | Admit: 2022-02-04 | Payer: Medicare Other | Source: Ambulatory Visit

## 2022-02-22 ENCOUNTER — Other Ambulatory Visit: Payer: Self-pay

## 2022-02-22 ENCOUNTER — Ambulatory Visit (INDEPENDENT_AMBULATORY_CARE_PROVIDER_SITE_OTHER): Payer: Medicare Other | Admitting: Nurse Practitioner

## 2022-02-22 ENCOUNTER — Encounter: Payer: Self-pay | Admitting: Nurse Practitioner

## 2022-02-22 DIAGNOSIS — I10 Essential (primary) hypertension: Secondary | ICD-10-CM | POA: Diagnosis not present

## 2022-02-22 DIAGNOSIS — I739 Peripheral vascular disease, unspecified: Secondary | ICD-10-CM

## 2022-02-22 DIAGNOSIS — E785 Hyperlipidemia, unspecified: Secondary | ICD-10-CM

## 2022-02-22 DIAGNOSIS — M79661 Pain in right lower leg: Secondary | ICD-10-CM

## 2022-02-22 DIAGNOSIS — E118 Type 2 diabetes mellitus with unspecified complications: Secondary | ICD-10-CM

## 2022-02-22 DIAGNOSIS — I714 Abdominal aortic aneurysm, without rupture, unspecified: Secondary | ICD-10-CM

## 2022-02-22 MED ORDER — ACCU-CHEK GUIDE VI STRP: ORAL_STRIP | 1 refills | Status: DC

## 2022-02-22 MED ORDER — PANTOPRAZOLE SODIUM 40 MG PO TBEC: 40.0000 mg | DELAYED_RELEASE_TABLET | Freq: Every day | ORAL | 0 refills | Status: DC

## 2022-02-22 MED ORDER — METOPROLOL SUCCINATE ER 50 MG PO TB24: 50.0000 mg | ORAL_TABLET | Freq: Two times a day (BID) | ORAL | 3 refills | Status: DC

## 2022-02-22 NOTE — Assessment & Plan Note (Signed)
Takes atorvastatin 40 mg daily, omega-3 fatty acid 1000 mg daily ?Check lipid panel at his next visit ?Avoid fried fatty foods. ?

## 2022-02-22 NOTE — Assessment & Plan Note (Addendum)
Lab Results  ?Component Value Date  ? HGBA1C 6.7 (H) 07/29/2021  ? ?Lab Results  ?Component Value Date  ? HGBA1C 6.7 (H) 07/29/2021  ? ?Takes metformin 500 mg twice daily, states that he had labs done 3 months ago at his previous PCP. ?Takes atorvastatin 40 mg daily, ?Not on ACEI/ARB, will discuss need for this at his next appointment. ? ?Patient states that he has been checking his blood sugar once daily daily.  Denies hypoglycemic episodes, reviewed readings reviewed by me, CBGs sometimes 140s 150s mostly below 140s.  ?Avoid sugar sweets soda ?Will obtain records from previous PCP ?Will check A1c at next appointment ? ?

## 2022-02-22 NOTE — Assessment & Plan Note (Addendum)
Rates pain as 3/10 ?Continue  OTC Tylenol as needed, engage in regular stretching exercises Will monitor condition,, has Voltaren gel ordered but not taking med.  ?

## 2022-02-22 NOTE — Patient Instructions (Signed)
?  Please get your shingles vaccine, TDAP vaccine and pneumonia vaccine at your pharmacy.  ? ? ?It is important that you exercise regularly at least 30 minutes 5 times a week.  ?Think about what you will eat, plan ahead. ?Choose " clean, green, fresh or frozen" over canned, processed or packaged foods which are more sugary, salty and fatty. ?70 to 75% of food eaten should be vegetables and fruit. ?Three meals at set times with snacks allowed between meals, but they must be fruit or vegetables. ?Aim to eat over a 12 hour period , example 7 am to 7 pm, and STOP after  your last meal of the day. ?Drink water,generally about 64 ounces per day, no other drink is as healthy. Fruit juice is best enjoyed in a healthy way, by EATING the fruit. ? ?Thanks for choosing [facility], we consider it a privelige to serve you.  ?

## 2022-02-22 NOTE — Assessment & Plan Note (Signed)
?  BP Readings from Last 3 Encounters:  ?02/22/22 134/85  ?08/11/21 (!) 168/88  ?07/29/21 (!) 151/64  ?Takes ,Amlodipine 10mg . Metoprolol 50mg  BID, spironolactone 50mg  daily ?Condition well-controlled, continue with current medications. ?DASH diet advised ?We will check labs at next visit.  ?

## 2022-02-22 NOTE — Assessment & Plan Note (Signed)
Has had a bilateral iliac stenting in the past. ?Followed by Dr. Arbie Cookey told, patient is due for yearly visit. ?  Last note from Dr. Arbie Cookey reviewed with, there was a plan for abdominal ultrasound for  AAA, carotid duplex and ABI study. ?Patient encouraged to call Dr.  office to schedule an appointment to be seen. ?

## 2022-02-22 NOTE — Progress Notes (Signed)
? ?New Patient Office Visit ? ?Subjective:  ?Patient ID: Thomas Lindsey, male    DOB: [birth year]  Age: 66 y.o. MRN: [id number] ? ?CC:  ?Chief Complaint  ?Patient presents with  ? New Patient (Initial Visit)  ?  NP  ? ? ?HPI ?Thomas Lindsey with past medical history of depression, diabetes mellitus, GERD, hypertension, PVD, abdominal aortic aneurysm presents to establish care for his chronic medical conditions.  previous PCP at [facility], moved from [place] 2 years ago.  ? ?HLD. Atorvastatin 40mg  daily, omega 3 faty acids 1000mg  daily, ? ? ?PAD, aortic abdominal aneurysm, has history of iliac angioplasty and stenting takes plavix 75mg  , denies rectal bleeding.  Patient is established with Dr. told last visit was over a year ago.  Patient encouraged to call Dr.  office to schedule follow-up.  Patient denies abdominal pain, bloody stool, new back pain. ? ?He had taken Wellbutrin 100mg  BID for smoking cessation, he was put back on the med while at due to having withdrawal symptoms from stopping med abruptly. Takes hydroxyzine 25mg  daily at bedtime for anxiety.  ? ?DM. metformin 500mg  BID.  Patient denies hypoglycemia increased urination polyphagia ? ?Insomnia . Ambien 10mg  daily .  Patient states that he has been taking medications for long time and cannot sleep without taking med.  Side effects of medication reviewed with patient patient advised not to take more than the dose prescribed he verbalized understanding ? ? ?Has history of degenerative disc disease with previous back surgeries patient states that he was supposed to have another back surgery last year but that his surgery was canceled due to him not having back pain anymore.  ? ? ?Pt c/o right shin pain that started 2 weeks ago, he was digging ditches in May last year, he tripped on clay, went into a hole with his left leg, he never had any pain then but since last 2 weeks he has been having intermittent pain on his  shin. Has denies tingling numbness, has a dull pain 3/10, takes tylenol but it does not help, rubbing it helps. ? ?Last colonoscopy was 5 years, he was told that it was normal and does not need another one for 10 years, pt states no ploys was found, he has had 3 normal colonoscopy done.  ? ?Due for TDAP vaccine, pneumonia vaccine and shingles  ? ?Wears CPAP every night , want a new supply for his CPAP. Pt will get back to about what supplies he needs.  ? ?Past Medical History:  ?Diagnosis Date  ? Aortic disorder (HCC)   ? Arthritis   ? Cervical radiculitis   ? Depression   ? Diabetes mellitus without complication (HCC)   ? Elevated glucose   ? GERD (gastroesophageal reflux disease)   ? Hypertension   ? Insomnia   ? Obstructive sleep apnea   ? Peripheral arterial disease (HCC)   ? Saccular aneurysm   ? Tinnitus of both ears   ? ? ?Past Surgical History:  ?Procedure Laterality Date  ? BACK SURGERY    ? STENT PLACEMENT ILIAC ([facility] HX) Bilateral   ? placed aproximately 17 years  ? ? ?Family History  ?Problem Relation Age of Onset  ? Heart disease Father   ? Hypertension Father   ? Hyperlipidemia Father   ? Kidney disease Father   ? ? ?Social History  ? ?Socioeconomic History  ? Marital status: Married  ?  Spouse name: Not on file  ?  Number of children: 3  ? Years of education: Not on file  ? Highest education level: Not on file  ?Occupational History  ? Not on file  ?Tobacco Use  ? Smoking status: Former  ?  Types: Cigarettes  ?  Quit date: 06/14/2007  ?  Years since quitting: 14.7  ? Smokeless tobacco: Never  ?Vaping Use  ? Vaping Use: Never used  ?Substance and Sexual Activity  ? Alcohol use: Not Currently  ?  Comment: Occasional  ? Drug use: Not Currently  ? Sexual activity: Not on file  ?Other Topics Concern  ? Not on file  ?Social History Narrative  ? Lives with his wife   ? ?Social Determinants of Health  ? ?Financial Resource Strain: Not on file  ?Food Insecurity: Not on file  ?Transportation Needs: Not on file   ?Physical Activity: Not on file  ?Stress: Not on file  ?Social Connections: Not on file  ?Intimate Partner Violence: Not on file  ? ? ?ROS ?Review of Systems  ?Constitutional: Negative.   ?Respiratory: Negative.    ?Cardiovascular: Negative.   ?Gastrointestinal: Negative.   ?Musculoskeletal: Negative.   ?     Right shin pain  ?Neurological: Negative.   ?Psychiatric/Behavioral: Negative.    ? ?Objective:  ? ?Today's Vitals: BP 134/85 (BP Location: Right Arm, Patient Position: Sitting, Cuff Size: Large)   Pulse 83   Ht 6\' 2"  (1.88 m)   Wt 299 lb (135.6 kg)   SpO2 96%   BMI 38.39 kg/m?  ? ?Physical Exam ?Constitutional:   ?   General: He is not in acute distress. ?   Appearance: He is obese. He is not ill-appearing, toxic-appearing or diaphoretic.  ?Cardiovascular:  ?   Rate and Rhythm: Normal rate and regular rhythm.  ?   Pulses: Normal pulses.  ?   Heart sounds: No murmur heard. ?  No friction rub. No gallop.  ?Pulmonary:  ?   Effort: Pulmonary effort is normal. No respiratory distress.  ?   Breath sounds: Normal breath sounds. No stridor. No wheezing, rhonchi or rales.  ?Chest:  ?   Chest wall: No tenderness.  ?Musculoskeletal:     ?   General: No swelling, tenderness, deformity or signs of injury. Normal range of motion.  ?   Right lower leg: No edema.  ?   Left lower leg: No edema.  ?Skin: ?   Capillary Refill: Capillary refill takes less than 2 seconds.  ?Neurological:  ?   Mental Status: He is alert.  ?Psychiatric:     ?   Mood and Affect: Mood normal.     ?   Behavior: Behavior normal.     ?   Thought Content: Thought content normal.     ?   Judgment: Judgment normal.  ? ? ?Assessment & Plan:  ? ?Problem List Items Addressed This Visit   ? ?  ? Cardiovascular and Mediastinum  ? Hypertension  ?   ?BP Readings from Last 3 Encounters:  ?02/22/22 134/85  ?08/11/21 (!) 168/88  ?07/29/21 (!) 151/64  ?Takes ,Amlodipine 10mg . Metoprolol 50mg  BID, spironolactone 50mg  daily ?Condition well-controlled, continue with  current medications. ?DASH diet advised ?We will check labs at next visit.  ?  ?  ? Relevant Medications  ? metoprolol succinate (TOPROL-XL) 50 MG 24 hr tablet  ? PAD (peripheral artery disease) (HCC)  ?  Has had a bilateral iliac stenting in the past. ?Followed by Dr. 07/31/21 told, patient is due for yearly  visit. ?  Last note from Dr. Arbie CookeyEarly reviewed with, there was a plan for abdominal ultrasound for  AAA, carotid duplex and ABI study. ?Patient encouraged to call Dr.  office to schedule an appointment to be seen. ?  ?  ? Relevant Medications  ? metoprolol succinate (TOPROL-XL) 50 MG 24 hr tablet  ? AAA (abdominal aortic aneurysm) without rupture  ?  Has upcoming CT abdomen with contrast ordered by previous PCP on 03/03/22.  To follow-up on AAA ?Patient encouraged to call Dr. Arbie CookeyEarly office to schedule a follow-up appointment. ?Importance of maintaining well-controlled blood pressure discussed with patient.  Blood pressure well controlled in office today. ? ? ?  ?  ? Relevant Medications  ? metoprolol succinate (TOPROL-XL) 50 MG 24 hr tablet  ?  ? Endocrine  ? Controlled diabetes mellitus type 2 with complications (HCC)  ?  Lab Results  ?Component Value Date  ? HGBA1C 6.7 (H) 07/29/2021  ? ?Lab Results  ?Component Value Date  ? HGBA1C 6.7 (H) 07/29/2021  ?Takes metformin 500 mg twice daily, states that he had labs done 3 months ago at his previous PCP. ?Takes atorvastatin 40 mg daily, ?Not on ACEI/ARB, will discuss need for this at his next appointment. ? ?Patient states that he has been checking his blood sugar once daily daily.  Denies hypoglycemic episodes, reviewed readings reviewed by me, CBGs sometimes 140s 150s mostly below 140s.  ?Avoid sugar sweets soda ?Will obtain records from previous PCP ?Will check A1c at next appointment ? ?  ?  ?  ? Other  ? Pain in right shin  ?  Rates pain as 3/10 ?Continue  OTC Tylenol as needed, engage in regular stretching exercises Will monitor condition,, has Voltaren gel ordered  but not taking med.  ?  ?  ? Hyperlipidemia  ?  Takes atorvastatin 40 mg daily, omega-3 fatty acid 1000 mg daily ?Check lipid panel at his next visit ?Avoid fried fatty foods. ?  ?  ? Relevant Medications

## 2022-02-22 NOTE — Assessment & Plan Note (Signed)
Has upcoming CT abdomen with contrast ordered by previous PCP on 03/03/22.  To follow-up on AAA ?Patient encouraged to call Dr. Donnetta Hutching office to schedule a follow-up appointment. ?Importance of maintaining well-controlled blood pressure discussed with patient.  Blood pressure well controlled in office today. ? ? ?

## 2022-03-03 ENCOUNTER — Ambulatory Visit (HOSPITAL_COMMUNITY)
Admission: RE | Admit: 2022-03-03 | Discharge: 2022-03-03 | Disposition: A | Discharge status: Home / Self Care | Payer: Medicare Other | Source: Ambulatory Visit | Attending: Nurse Practitioner | Admitting: Nurse Practitioner

## 2022-03-03 DIAGNOSIS — I714 Abdominal aortic aneurysm, without rupture, unspecified: Secondary | ICD-10-CM | POA: Diagnosis present

## 2022-03-03 DIAGNOSIS — I700 Atherosclerosis of aorta: Secondary | ICD-10-CM | POA: Diagnosis present

## 2022-03-03 DIAGNOSIS — R229 Localized swelling, mass and lump, unspecified: Secondary | ICD-10-CM | POA: Insufficient documentation

## 2022-03-03 LAB — POCT I-STAT CREATININE: Creatinine, Ser: 1 mg/dL (ref 0.61–1.24)

## 2022-03-03 MED ORDER — IOHEXOL 300 MG/ML  SOLN
100.0000 mL | Freq: Once | INTRAMUSCULAR | Status: AC | PRN
  Administered 2022-03-03: 100 mL via INTRAVENOUS

## 2022-03-07 ENCOUNTER — Telehealth: Payer: Self-pay

## 2022-03-07 NOTE — Telephone Encounter (Signed)
Patient called and would like results from CT scan on 03/03/22.  He said he does not see anything in My Chart yet.  Can you please call him with results.  I did tell him Mitzi Davenport is not in the office today.  ?

## 2022-03-08 NOTE — Telephone Encounter (Signed)
Please advise 

## 2022-03-09 NOTE — Telephone Encounter (Signed)
Spoke with pt advised of results and that he needs to follow up with Dr Early pt verbalized understanding ?

## 2022-03-25 ENCOUNTER — Other Ambulatory Visit: Payer: Self-pay | Admitting: *Deleted

## 2022-03-25 DIAGNOSIS — R0989 Other specified symptoms and signs involving the circulatory and respiratory systems: Secondary | ICD-10-CM

## 2022-03-25 DIAGNOSIS — I714 Abdominal aortic aneurysm, without rupture, unspecified: Secondary | ICD-10-CM

## 2022-04-06 ENCOUNTER — Ambulatory Visit (INDEPENDENT_AMBULATORY_CARE_PROVIDER_SITE_OTHER): Payer: Medicare Other

## 2022-04-06 ENCOUNTER — Encounter: Payer: Self-pay | Admitting: Vascular Surgery

## 2022-04-06 ENCOUNTER — Ambulatory Visit: Payer: Medicare Other | Admitting: Vascular Surgery

## 2022-04-06 VITALS — BP 154/74 | HR 76 | Temp 98.5°F | Resp 18 | Ht 74.0 in | Wt 292.8 lb

## 2022-04-06 DIAGNOSIS — R0989 Other specified symptoms and signs involving the circulatory and respiratory systems: Secondary | ICD-10-CM

## 2022-04-06 DIAGNOSIS — I714 Abdominal aortic aneurysm, without rupture, unspecified: Secondary | ICD-10-CM

## 2022-04-06 NOTE — Progress Notes (Unsigned)
? ? [facility] [place] ? ?Patient name: Thomas Lindsey MRN: [id number] DOB: 09-12-1956 Sex: male ? ?REASON FOR VISIT: Follow-up known small abdominal aortic aneurysm and also bilateral common iliac artery stenting and carotid disease ? ?HPI: ?[name] is a 66 y.o. male here today for follow-up.  He continues to do quite well.  He moved to [place] from [place] and establish follow-up with me 1 year ago.  He reports some continued right flank pain.  Evaluation included CT of his chest and abdomen which I have for review.  He denies any cardiac issues and has no TIA or stroke symptoms ? ?Past Medical History:  ?Diagnosis Date  ? Aortic disorder (HCC)   ? Arthritis   ? Cervical radiculitis   ? Depression   ? Diabetes mellitus without complication (HCC)   ? Elevated glucose   ? GERD (gastroesophageal reflux disease)   ? Hypertension   ? Insomnia   ? Obstructive sleep apnea   ? Peripheral arterial disease (HCC)   ? Saccular aneurysm   ? Tinnitus of both ears   ? ? ?Family History  ?Problem Relation Age of Onset  ? Heart disease Father   ? Hypertension Father   ? Hyperlipidemia Father   ? Kidney disease Father   ? ? ?SOCIAL HISTORY: ?Social History  ? ?Tobacco Use  ? Smoking status: Former  ?  Types: Cigarettes  ?  Quit date: 06/14/2007  ?  Years since quitting: 14.8  ? Smokeless tobacco: Never  ?Substance Use Topics  ? Alcohol use: Not Currently  ?  Comment: Occasional  ? ? ?Allergies  ?Allergen Reactions  ? Hctz [Hydrochlorothiazide] Other (See Comments)  ?  Dizzy  ? ? ?Current Outpatient Medications  ?Medication Sig Dispense Refill  ? amLODipine (NORVASC) 10 MG tablet Take 10 mg by mouth daily.    ? atorvastatin (LIPITOR) 40 MG tablet Take 40 mg by mouth daily.    ? cholecalciferol (VITAMIN D3) 25 MCG (1000 UNIT) tablet Take 1,000 Units by mouth daily.    ? clopidogrel (PLAVIX) 75 MG tablet Take 75 mg by mouth daily.    ? glucose blood  (ACCU-CHEK GUIDE) test strip Check blood sugar once daily. 100 each 1  ? metFORMIN (GLUCOPHAGE) 500 MG tablet Take 500 mg by mouth 2 (two) times daily with a meal.    ? metoprolol succinate (TOPROL-XL) 50 MG 24 hr tablet Take 1 tablet (50 mg total) by mouth 2 (two) times daily. 60 tablet 3  ? Omega-3 Fatty Acids (FISH OIL) 1000 MG CAPS Take 1,000 mg by mouth daily.    ? pantoprazole (PROTONIX) 40 MG tablet Take 1 tablet (40 mg total) by mouth daily. 90 tablet 0  ? spironolactone (ALDACTONE) 50 MG tablet Take 50 mg by mouth 2 (two) times daily.    ? vitamin C (ASCORBIC ACID) 500 MG tablet Take 500 mg by mouth daily.    ? zolpidem (AMBIEN) 10 MG tablet Take 10 mg by mouth at bedtime.    ? buPROPion ER [facility] SR) 100 MG 12 hr tablet Take 100 mg by mouth every Monday, Wednesday, and Friday. (Patient not taking: Reported on 04/06/2022)    ? cyclobenzaprine (FLEXERIL) 5 MG tablet Take 5 mg by mouth 2 (two) times daily as needed for muscle spasms. (Patient not taking: Reported on 02/22/2022)    ? diclofenac Sodium (VOLTAREN) 1 % GEL Apply 1 application topically daily as needed (pain). (Patient not taking: Reported on  02/22/2022)    ? hydrOXYzine (ATARAX) 25 MG tablet Take 25 mg by mouth 2 (two) times daily.    ? meloxicam (MOBIC) 15 MG tablet Take 15 mg by mouth daily. (Patient not taking: Reported on 02/22/2022)    ? ?No current facility-administered medications for this visit.  ? ? ?REVIEW OF SYSTEMS:  ?[X]  denotes positive finding, [ ]  denotes negative finding ?Cardiac  Comments:  ?Chest pain or chest pressure:    ?Shortness of breath upon exertion:    ?Short of breath when lying flat:    ?Irregular heart rhythm:    ?    ?Vascular    ?Pain in calf, thigh, or hip brought on by ambulation:    ?Pain in feet at night that wakes you up from your sleep:     ?Blood clot in your veins:    ?Leg swelling:     ?    ? ? ?PHYSICAL EXAM: ?Vitals:  ? 04/06/22 1036 04/06/22 1039  ?BP: (!) 146/82 (!) 154/74  ?Pulse: 76   ?Resp: 18    ?Temp: 98.5 ?F (36.9 ?C)   ?TempSrc: Temporal   ?SpO2: 97%   ?Weight: 292 lb 12.8 oz (132.8 kg)   ?Height: 6\' 2"  (1.88 m)   ? ? ?GENERAL: The patient is a well-nourished male, in no acute distress. The vital signs are documented above. ?CARDIOVASCULAR: 2+ radial pulses bilaterally.  2+ dorsalis pedis pulses bilaterally.  I do not palpate popliteal aneurysms bilaterally. ?PULMONARY: There is good air exchange  ?MUSCULOSKELETAL: There are no major deformities or cyanosis. ?NEUROLOGIC: No focal weakness or paresthesias are detected. ?SKIN: There are no ulcers or rashes noted. ?PSYCHIATRIC: The patient has a normal affect. ? ?DATA:  ?Carotid duplex today reveals no evidence of critical stenosis with mild thickening bilaterally ? ?Aortic duplex shows no change in his 3.0 cm aortic aneurysm ? ?Iliac stents are patent with some elevated velocities bilaterally ? ?I did review his CT scans which shows severe calcification of his aorta and confirms mild dilatation of the infrarenal segment. ? ?MEDICAL ISSUES: ?Stable overall.  He will continue his usual activities.  I recommended ultrasound of his aorta in 2 years to rule out any expansion.  Also reviewed symptoms of medication and he knows to contact 06/06/22 should this recur. ? ? ? ?06/06/22, MD FACS ?[facility] ?Office Tel [phone] ? ?Note: Portions of this report may have been transcribed using voice recognition software.  Every effort has been made to ensure accuracy; however, inadvertent computerized transcription errors may still be present. ?

## 2022-04-13 ENCOUNTER — Telehealth: Payer: Self-pay

## 2022-04-13 ENCOUNTER — Other Ambulatory Visit: Payer: Self-pay

## 2022-04-13 ENCOUNTER — Other Ambulatory Visit: Payer: Self-pay | Admitting: Nurse Practitioner

## 2022-04-13 MED ORDER — METOPROLOL SUCCINATE ER 50 MG PO TB24: 50.0000 mg | ORAL_TABLET | Freq: Two times a day (BID) | ORAL | 3 refills | Status: DC

## 2022-04-13 MED ORDER — PANTOPRAZOLE SODIUM 40 MG PO TBEC: 40.0000 mg | DELAYED_RELEASE_TABLET | Freq: Every day | ORAL | 0 refills | Status: DC

## 2022-04-13 MED ORDER — ZOLPIDEM TARTRATE 10 MG PO TABS: 10.0000 mg | ORAL_TABLET | Freq: Every day | ORAL | 0 refills | Status: DC

## 2022-04-13 NOTE — Telephone Encounter (Signed)
I have refilled the metoprolol but not not the ambien or omeprazole. Not showing omeprazole on list. Please advise ?

## 2022-04-13 NOTE — Telephone Encounter (Signed)
Patient called need med refill before next appt ? ?metoprolol 25 mg ? ?zolpidem (AMBIEN) 10 MG tablet  ? ?Omeprazole 20 mg capsule ? ? ?Pharmacy: [facility] ? ?

## 2022-04-27 ENCOUNTER — Encounter: Payer: Self-pay | Admitting: Nurse Practitioner

## 2022-04-27 ENCOUNTER — Ambulatory Visit (INDEPENDENT_AMBULATORY_CARE_PROVIDER_SITE_OTHER): Payer: Medicare Other | Admitting: Nurse Practitioner

## 2022-04-27 VITALS — BP 145/78 | HR 73 | Ht 74.0 in | Wt 294.0 lb

## 2022-04-27 DIAGNOSIS — M79645 Pain in left finger(s): Secondary | ICD-10-CM

## 2022-04-27 DIAGNOSIS — E118 Type 2 diabetes mellitus with unspecified complications: Secondary | ICD-10-CM

## 2022-04-27 DIAGNOSIS — R0683 Snoring: Secondary | ICD-10-CM

## 2022-04-27 DIAGNOSIS — I10 Essential (primary) hypertension: Secondary | ICD-10-CM | POA: Diagnosis not present

## 2022-04-27 DIAGNOSIS — G4730 Sleep apnea, unspecified: Secondary | ICD-10-CM

## 2022-04-27 DIAGNOSIS — E785 Hyperlipidemia, unspecified: Secondary | ICD-10-CM

## 2022-04-27 DIAGNOSIS — G4733 Obstructive sleep apnea (adult) (pediatric): Secondary | ICD-10-CM | POA: Insufficient documentation

## 2022-04-27 MED ORDER — LISINOPRIL 5 MG PO TABS: 5.0000 mg | ORAL_TABLET | Freq: Every day | ORAL | 3 refills | Status: DC

## 2022-04-27 MED ORDER — METOPROLOL SUCCINATE ER 100 MG PO TB24: 100.0000 mg | ORAL_TABLET | Freq: Every day | ORAL | 3 refills | Status: DC

## 2022-04-27 MED ORDER — ATORVASTATIN CALCIUM 40 MG PO TABS: 40.0000 mg | ORAL_TABLET | Freq: Every day | ORAL | 0 refills | Status: DC

## 2022-04-27 NOTE — Assessment & Plan Note (Addendum)
BP Readings from Last 3 Encounters:  04/27/22 (!) 145/78  04/06/22 (!) 154/74  02/22/22 134/85  Chronic medical condition uncontrolled Currently on metoprolol XL 50 mg bid , amlodipine 10 mg daily, spironolactone 50 mg twice daily  Start lisinopril 5 mg daily, metoprolol xl  changed to 100 mg once daily DASH diet advised engage in regular moderate exercises at least 150 minutes as tolerated Monitor blood pressure daily at home keep a log and bring readings to next follow-up visit Follow-up in 4 weeks CMP today BMP in 2 weeks

## 2022-04-27 NOTE — Assessment & Plan Note (Addendum)
Has been on CPAP for 7 years Would like insurance to be providing supplies Sleep study ordered today which will assist in getting coverage for his CPAP supplies

## 2022-04-27 NOTE — Patient Instructions (Addendum)
Please start taking lisinopril 5mg  daily. Monitor blood pressure keep a log and bring to next visit.  Please stop taking protonix, continue omeprazole 20mg  daily.     It is important that you exercise regularly at least 30 minutes 5 times a week.  Think about what you will eat, plan ahead. Choose " clean, green, fresh or frozen" over canned, processed or packaged foods which are more sugary, salty and fatty. 70 to 75% of food eaten should be vegetables and fruit. Three meals at set times with snacks allowed between meals, but they must be fruit or vegetables. Aim to eat over a 12 hour period , example 7 am to 7 pm, and STOP after  your last meal of the day. Drink water,generally about 64 ounces per day, no other drink is as healthy. Fruit juice is best enjoyed in a healthy way, by EATING the fruit.  Thanks for choosing [facility], we consider it a privelige to serve you.

## 2022-04-27 NOTE — Assessment & Plan Note (Addendum)
Currently on metformin 500 mg twice daily Avoid sugar sweets soda Check A1c Had eye exam done 3 weeks ago will obtain records from his ophthalmologist Foot exam completed today-normal Urine creatinine labs ordered

## 2022-04-27 NOTE — Assessment & Plan Note (Signed)
Currently on atorvastatin 40 mg daily Goal is LDL less than 70 Check fasting lipid panel

## 2022-04-27 NOTE — Progress Notes (Signed)
Thomas Lindsey     MRN: [id number]      DOB: September 08, 1956   HPI Thomas Lindsey with past medical history of AAA without rupture, PAD, hypertension, controlled diabetes type 2 is here for follow up and re-evaluation of chronic medical conditions, medication management.  Sleep apnea. He has been using CPAP for about 7 years.Need supplies for his CPAP , he has been getting supplies from [employer] paying out of pocket, but would like insurance to be taking care of his supplies  He has seen Dr Donnetta Hutching , he was told that his AAA is stable, he was told that he would need a follow up in 2 years for monitoring .  Patient denies abdominal pain.  Thomas Lindsey. Pt has been taking protonix 40mg  daily with omeprazole 20mg  daily , states that his symptoms has been well controlled. We discussed stopping protonix and taking omeprazole 20mg   daily , and PRN after a month. Pt denies constipation, blood in the stool, abdominal pain.   HTN . He has taken metoprolol 50 mg bid, spironolactone 50mg  BID and amlodipine 10mg  at night.  Patient stated that he has taking metoprolol and spironolactone today , he has not been monitoring his blood pressure at home .patient denies edema, chest pain, syncope  Pt refused TDAP vaccine, shingles vaccine , pneumonia vaccine , need for all vaccines discussed pt verbalized understanding .    Pt c/o of a discoloration on his right thumb since the past one month, site is painful when he aplies pressure to it. Denies fever, chills, trauma    ROS Denies recent fever or chills. Denies sinus pressure, nasal congestion, ear pain or sore throat. Denies chest congestion, productive cough or wheezing. Denies chest pains, palpitations and leg swelling Denies abdominal pain, nausea, vomiting,diarrhea or constipation.   Denies dysuria, frequency, hesitancy or incontinence.. Denies headaches, seizures, numbness, or tingling.       PE  BP (!) 145/78   Pulse 73   Ht 6\' 2"  (1.88 m)   Wt 294 lb  (133.4 kg)   SpO2 94%   BMI 37.75 kg/m   Patient alert and oriented and in no cardiopulmonary distress.  Chest: Clear to auscultation bilaterally.  CVS: S1, S2 no murmurs, no S3.Regular rate.  ABD: Soft non tender.   Ext: No edema  MS: Adequate ROM spine, shoulders, hips and knees.  Left thumb nail slightly appears white along the edge , no redness, swelling, drainage noted.   Psych: Good eye contact, normal affect. Memory intact not anxious or depressed appearing.    Assessment & Plan  Hypertension BP Readings from Last 3 Encounters:  04/27/22 (!) 145/78  04/06/22 (!) 154/74  02/22/22 134/85  Chronic medical condition uncontrolled Currently on metoprolol XL 50 mg bid , amlodipine 10 mg daily, spironolactone 50 mg twice daily  Start lisinopril 5 mg daily, metoprolol xl  changed to 100 mg once daily DASH diet advised engage in regular moderate exercises at least 150 minutes as tolerated Monitor blood pressure daily at home keep a log and bring readings to next follow-up visit Follow-up in 4 weeks CMP today BMP in 2 weeks   Controlled diabetes mellitus type 2 with complications (HCC) Currently on metformin 500 mg twice daily Avoid sugar sweets soda Check A1c Had eye exam done 3 weeks ago will obtain records from his ophthalmologist Foot exam completed today-normal Urine creatinine labs ordered  Hyperlipidemia Currently on atorvastatin 40 mg daily Goal is LDL less than 70 Check fasting lipid  panel  Thumb pain, left No sign of infection noted on examination Suspecting trauma Will monitor for now  Sleep apnea Has been on CPAP for 7 years Would like insurance to be providing supplies Sleep study ordered today which will assist in getting coverage for his CPAP supplies

## 2022-04-27 NOTE — Assessment & Plan Note (Signed)
No sign of infection noted on examination Suspecting trauma Will monitor for now

## 2022-04-29 ENCOUNTER — Other Ambulatory Visit: Payer: Self-pay | Admitting: Nurse Practitioner

## 2022-04-29 LAB — CMP14+EGFR
ALT: 29 IU/L (ref 0–44)
AST: 22 IU/L (ref 0–40)
Albumin/Globulin Ratio: 1.8 (ref 1.2–2.2)
Albumin: 4.9 g/dL — ABNORMAL HIGH (ref 3.8–4.8)
Alkaline Phosphatase: 66 IU/L (ref 44–121)
BUN/Creatinine Ratio: 20 (ref 10–24)
BUN: 19 mg/dL (ref 8–27)
Bilirubin Total: 0.4 mg/dL (ref 0.0–1.2)
CO2: 24 mmol/L (ref 20–29)
Calcium: 10.6 mg/dL — ABNORMAL HIGH (ref 8.6–10.2)
Chloride: 98 mmol/L (ref 96–106)
Creatinine, Ser: 0.93 mg/dL (ref 0.76–1.27)
Globulin, Total: 2.7 g/dL (ref 1.5–4.5)
Glucose: 132 mg/dL — ABNORMAL HIGH (ref 70–99)
Potassium: 4.9 mmol/L (ref 3.5–5.2)
Sodium: 138 mmol/L (ref 134–144)
Total Protein: 7.6 g/dL (ref 6.0–8.5)
eGFR: 91 mL/min/{1.73_m2} (ref >59)

## 2022-04-29 LAB — MICROALBUMIN / CREATININE URINE RATIO
Creatinine, Urine: 99.2 mg/dL
Microalb/Creat Ratio: 4 mg/g creat (ref 0–29)
Microalbumin, Urine: 4.2 ug/mL

## 2022-04-29 LAB — HEMOGLOBIN A1C
Est. average glucose Bld gHb Est-mCnc: 146 mg/dL
Hgb A1c MFr Bld: 6.7 % — ABNORMAL HIGH (ref 4.8–5.6)

## 2022-04-29 NOTE — Progress Notes (Signed)
Calcium level is high, patient should avoid calcium supplements drink at least 64 ounces of water daily.  Patient should repeat his labs in 2 weeks to recheck his calcium level.  A1c is stable at 6.7  Urine creatinine labs is normal  Continue current medications

## 2022-05-12 ENCOUNTER — Other Ambulatory Visit: Payer: Self-pay | Admitting: Nurse Practitioner

## 2022-05-12 LAB — CALCIUM: Calcium: 9.6 mg/dL (ref 8.6–10.2)

## 2022-05-20 ENCOUNTER — Telehealth: Payer: Self-pay | Admitting: Nurse Practitioner

## 2022-05-20 ENCOUNTER — Other Ambulatory Visit: Payer: Self-pay | Admitting: Nurse Practitioner

## 2022-05-20 MED ORDER — CLOPIDOGREL BISULFATE 75 MG PO TABS: 75.0000 mg | ORAL_TABLET | Freq: Every day | ORAL | 1 refills | Status: DC

## 2022-05-20 NOTE — Telephone Encounter (Signed)
Pt called stating that he is needing to meds refilled. Since he is a new pt, he did not need refills to now. Can you please refill?    Clopidogrel (plavix) 75mg  tablet  Amlodipine (norvasc) 10mg  tablet     [facility] mail ordered

## 2022-05-20 NOTE — Telephone Encounter (Signed)
Please advise filled 2021

## 2022-05-24 ENCOUNTER — Telehealth: Payer: Self-pay | Admitting: Nurse Practitioner

## 2022-05-24 ENCOUNTER — Other Ambulatory Visit: Payer: Self-pay | Admitting: Nurse Practitioner

## 2022-05-24 MED ORDER — AMLODIPINE BESYLATE 10 MG PO TABS: 10.0000 mg | ORAL_TABLET | Freq: Every day | ORAL | 1 refills | Status: DC

## 2022-05-24 NOTE — Telephone Encounter (Signed)
Patient needs refill on   amLODipine (NORVASC) 10 MG tablet   [facility]

## 2022-05-24 NOTE — Telephone Encounter (Signed)
Please advise last filled 2021

## 2022-05-30 ENCOUNTER — Ambulatory Visit (INDEPENDENT_AMBULATORY_CARE_PROVIDER_SITE_OTHER): Payer: Medicare Other

## 2022-05-30 DIAGNOSIS — Z0000 Encounter for general adult medical examination without abnormal findings: Secondary | ICD-10-CM | POA: Diagnosis not present

## 2022-05-30 NOTE — Progress Notes (Signed)
Subjective:   Thomas Lindsey is a 66 y.o. male who presents for Medicare Annual/Subsequent preventive examination. I connected with  [name] on 05/30/22 by a audio enabled telemedicine application and verified that I am speaking with the correct person using two identifiers.  Patient Location: Home  Provider Location: Office/Clinic  I discussed the limitations of evaluation and management by telemedicine. The patient expressed understanding and agreed to proceed.  Review of Systems         Objective:    There were no vitals filed for this visit. There is no height or weight on file to calculate BMI.     07/29/2021   10:27 AM  Advanced Directives  Does Patient Have a Medical Advance Directive? Yes  Type of Estate agent of [place];Living will  Does patient want to make changes to medical advance directive? No - Patient declined  Copy of Healthcare Power of Attorney in Chart? No - copy requested  Would patient like information on creating a medical advance directive? No - Patient declined    Current Medications (verified) Outpatient Encounter Medications as of 05/30/2022  Medication Sig   amLODipine (NORVASC) 10 MG tablet Take 1 tablet (10 mg total) by mouth daily.   atorvastatin (LIPITOR) 40 MG tablet Take 1 tablet (40 mg total) by mouth daily.   cholecalciferol (VITAMIN D3) 25 MCG (1000 UNIT) tablet Take 1,000 Units by mouth daily.   clopidogrel (PLAVIX) 75 MG tablet Take 1 tablet (75 mg total) by mouth daily.   cyclobenzaprine (FLEXERIL) 5 MG tablet Take 5 mg by mouth 2 (two) times daily as needed for muscle spasms.   diclofenac Sodium (VOLTAREN) 1 % GEL Apply 1 application. topically daily as needed (pain).   glucose blood (ACCU-CHEK GUIDE) test strip Check blood sugar once daily.   hydrOXYzine (ATARAX) 25 MG tablet Take 25 mg by mouth 2 (two) times daily.   lisinopril (ZESTRIL) 5 MG tablet Take 1 tablet (5 mg total) by mouth daily.    meloxicam (MOBIC) 15 MG tablet Take 15 mg by mouth daily.   metFORMIN (GLUCOPHAGE) 500 MG tablet Take 500 mg by mouth 2 (two) times daily with a meal.   metoprolol succinate (TOPROL-XL) 100 MG 24 hr tablet Take 1 tablet (100 mg total) by mouth daily. Take with or immediately following a meal.   Omega-3 Fatty Acids (FISH OIL) 1000 MG CAPS Take 1,000 mg by mouth daily.   omeprazole (PRILOSEC) 20 MG capsule Take 20 mg by mouth daily.   spironolactone (ALDACTONE) 50 MG tablet Take 50 mg by mouth 2 (two) times daily.   vitamin C (ASCORBIC ACID) 500 MG tablet Take 500 mg by mouth daily.   zolpidem (AMBIEN) 10 MG tablet TAKE 1 TABLET AT BEDTIME   No facility-administered encounter medications on file as of 05/30/2022.    Allergies (verified) Hctz [hydrochlorothiazide]   History: Past Medical History:  Diagnosis Date   Aortic disorder (HCC)    Arthritis    Cervical radiculitis    Depression    Diabetes mellitus without complication (HCC)    Elevated glucose    GERD (gastroesophageal reflux disease)    Hypertension    Insomnia    Obstructive sleep apnea    Peripheral arterial disease (HCC)    Saccular aneurysm    Tinnitus of both ears    Past Surgical History:  Procedure Laterality Date   BACK SURGERY     STENT PLACEMENT ILIAC ([facility] HX) Bilateral  placed aproximately 17 years   Family History  Problem Relation Age of Onset   Heart disease Father    Hypertension Father    Hyperlipidemia Father    Kidney disease Father    Social History   Socioeconomic History   Marital status: Married    Spouse name: Not on file   Number of children: 3   Years of education: Not on file   Highest education level: Not on file  Occupational History   Not on file  Tobacco Use   Smoking status: Former    Types: Cigarettes    Quit date: 06/14/2007    Years since quitting: 14.9   Smokeless tobacco: Never  Vaping Use   Vaping Use: Never used  Substance and Sexual Activity   Alcohol use:  Not Currently    Comment: Occasional   Drug use: Not Currently   Sexual activity: Not on file  Other Topics Concern   Not on file  Social History Narrative   Lives with his wife    Social Determinants of Health   Financial Resource Strain: Not on file  Food Insecurity: Not on file  Transportation Needs: Not on file  Physical Activity: Not on file  Stress: Not on file  Social Connections: Not on file    Tobacco Counseling Counseling given: Not Answered   Clinical Intake:              How often do you need to have someone help you when you read instructions, pamphlets, or other written materials from your doctor or pharmacy?: (P) 1 - Never  Diabetic?yes  Nutrition Risk Assessment:  Has the patient had any N/V/D within the last 2 months?  No  Does the patient have any non-healing wounds?  No  Has the patient had any unintentional weight loss or weight gain?  No   Diabetes:  Is the patient diabetic?  Yes  If diabetic, was a CBG obtained today?  No  Did the patient bring in their glucometer from home?  No  How often do you monitor your CBG's? daily.   Financial Strains and Diabetes Management:  Are you having any financial strains with the device, your supplies or your medication? No .  Does the patient want to be seen by Chronic Care Management for management of their diabetes?  No  Would the patient like to be referred to a Nutritionist or for Diabetic Management?  No   Diabetic Exams:  Diabetic Eye Exam: Completed   Diabetic Foot Exam: Completed 04/27/2022           Activities of Daily Living    05/29/2022    9:46 AM 07/29/2021   10:33 AM  In your present state of health, do you have any difficulty performing the following activities:  Hearing? 0   Vision? 0   Difficulty concentrating or making decisions? 0   Walking or climbing stairs? 0   Dressing or bathing? 0   Doing errands, shopping? 0 0  Preparing Food and eating ? N   Using the Toilet?  N   In the past six months, have you accidently leaked urine? N   Do you have problems with loss of bowel control? N   Managing your Medications? N   Managing your Finances? N   Housekeeping or managing your Housekeeping? N     Patient Care Team: Donell Beers, FNP as PCP - General (Nurse Practitioner)  Indicate any recent Medical Services you may have received  from other than [facility] providers in the past year (date may be approximate).     Assessment:   This is a routine wellness examination for [place].  Hearing/Vision screen No results found.  Dietary issues and exercise activities discussed:     Goals Addressed   None    Depression Screen    04/27/2022    8:05 AM 02/22/2022    8:07 AM  PHQ 2/9 Scores  PHQ - 2 Score 0 0    Fall Risk    05/29/2022    9:46 AM 04/27/2022    8:05 AM 02/22/2022    8:07 AM  Fall Risk   Falls in the past year? 0 0 1  Number falls in past yr:  0 0  Injury with Fall?  0 1  Comment   hurt his back  Risk for fall due to :  No Fall Risks History of fall(s)  Follow up  Falls evaluation completed Falls evaluation completed    FALL RISK PREVENTION PERTAINING TO THE HOME:  Any stairs in or around the home? Yes  If so, are there any without handrails? No  Home free of loose throw rugs in walkways, pet beds, electrical cords, etc? Yes  Adequate lighting in your home to reduce risk of falls? Yes   ASSISTIVE DEVICES UTILIZED TO PREVENT FALLS:  Life alert? No  Use of a cane, walker or w/c? No  Grab bars in the bathroom? Yes  Shower chair or bench in shower? Yes  Elevated toilet seat or a handicapped toilet? No   TIMED UP AND GO:  Was the test performed? No .  Length of time to ambulate 10 feet:  sec.     Cognitive Function:        Immunizations Immunization History  Administered Date(s) Administered   Influenza-Unspecified 09/13/2021   Moderna Covid-19 Vaccine Bivalent Booster 37yrs & up 06/24/2021, 09/13/2021   Moderna  SARS-COV2 Booster Vaccination 10/20/2020   PFIZER Comirnaty(Gray Top)Covid-19 Tri-Sucrose Vaccine 02/07/2020, 02/28/2020    TDAP status: Up to date  Flu Vaccine status: Up to date  Pneumococcal vaccine status: Up to date  Covid-19 vaccine status: Completed vaccines  Qualifies for Shingles Vaccine? No   Zostavax completed No   Shingrix Completed?: No.    Education has been provided regarding the importance of this vaccine. Patient has been advised to call insurance company to determine out of pocket expense if they have not yet received this vaccine. Advised may also receive vaccine at local pharmacy or Health Dept. Verbalized acceptance and understanding.  Screening Tests Health Maintenance  Topic Date Due   OPHTHALMOLOGY EXAM  Never done   Hepatitis C Screening  Never done   COVID-19 Vaccine (4 - Pfizer series) 01/14/2022   Zoster Vaccines- Shingrix (1 of 2) 07/28/2022 (Originally 05/20/2006)   Pneumonia Vaccine 50+ Years old (1 - PCV) 04/28/2023 (Originally 05/20/2021)   TETANUS/TDAP  04/28/2023 (Originally 05/21/1975)   INFLUENZA VACCINE  07/05/2022   HEMOGLOBIN A1C  10/28/2022   FOOT EXAM  04/28/2023   COLONOSCOPY (Pts 45-23yrs Insurance coverage will need to be confirmed)  05/21/2027   HPV VACCINES  Aged Out    Health Maintenance  Health Maintenance Due  Topic Date Due   OPHTHALMOLOGY EXAM  Never done   Hepatitis C Screening  Never done   COVID-19 Vaccine (4 - Pfizer series) 01/14/2022    Colorectal cancer screening: Type of screening: Colonoscopy. Completed 05/20/2017. Repeat every 10 years  Lung Cancer Screening: (Low Dose CT  Chest recommended if Age 64-80 years, 30 pack-year currently smoking OR have quit w/in 15years.) does qualify.   Lung Cancer Screening Referral:   Additional Screening:  Hepatitis C Screening: does qualify; Completed   Vision Screening: Recommended annual ophthalmology exams for early detection of glaucoma and other disorders of the eye. Is  the patient up to date with their annual eye exam?  Yes  Who is the provider or what is the name of the office in which the patient attends annual eye exams? [facility] If pt is not established with a provider, would they like to be referred to a provider to establish care? No .   Dental Screening: Recommended annual dental exams for proper oral hygiene  Community Resource Referral / Chronic Care Management: CRR required this visit?  No   CCM required this visit?  No      Plan:     I have personally reviewed and noted the following in the patient's chart:   Medical and social history Use of alcohol, tobacco or illicit drugs  Current medications and supplements including opioid prescriptions. Patient is not currently taking opioid prescriptions. Functional ability and status Nutritional status Physical activity Advanced directives List of other physicians Hospitalizations, surgeries, and ER visits in previous 12 months Vitals Screenings to include cognitive, depression, and falls Referrals and appointments  In addition, I have reviewed and discussed with patient certain preventive protocols, quality metrics, and best practice recommendations. A written personalized care plan for preventive services as well as general preventive health recommendations were provided to patient.     Harriet Pho, CMA   05/30/2022   Nurse Notes:

## 2022-06-02 ENCOUNTER — Other Ambulatory Visit: Payer: Self-pay

## 2022-06-02 DIAGNOSIS — G4730 Sleep apnea, unspecified: Secondary | ICD-10-CM

## 2022-06-08 ENCOUNTER — Encounter: Payer: Self-pay | Admitting: Nurse Practitioner

## 2022-06-08 ENCOUNTER — Ambulatory Visit (INDEPENDENT_AMBULATORY_CARE_PROVIDER_SITE_OTHER): Payer: Medicare Other | Admitting: Nurse Practitioner

## 2022-06-08 VITALS — BP 128/77 | HR 76 | Ht 74.0 in | Wt 293.0 lb

## 2022-06-08 DIAGNOSIS — I10 Essential (primary) hypertension: Secondary | ICD-10-CM | POA: Diagnosis not present

## 2022-06-08 DIAGNOSIS — R252 Cramp and spasm: Secondary | ICD-10-CM | POA: Insufficient documentation

## 2022-06-08 DIAGNOSIS — E118 Type 2 diabetes mellitus with unspecified complications: Secondary | ICD-10-CM | POA: Diagnosis not present

## 2022-06-08 DIAGNOSIS — E669 Obesity, unspecified: Secondary | ICD-10-CM | POA: Insufficient documentation

## 2022-06-08 DIAGNOSIS — E782 Mixed hyperlipidemia: Secondary | ICD-10-CM

## 2022-06-08 DIAGNOSIS — Z6837 Body mass index (BMI) 37.0-37.9, adult: Secondary | ICD-10-CM

## 2022-06-08 NOTE — Assessment & Plan Note (Addendum)
Chronic condition  Continue Flexeril 5 mg twice daily as needed Engage in regular exercises patient encouraged to maintain proper hydration

## 2022-06-08 NOTE — Assessment & Plan Note (Addendum)
Wt Readings from Last 3 Encounters:  06/08/22 293 lb (132.9 kg)  04/27/22 294 lb (133.4 kg)  04/06/22 292 lb 12.8 oz (132.8 kg)  Stated that he has been eating a lot of vegetables. Patient counseled on carb modified diet encouraged to engage in regular moderate exercises at least for 50 minutes weekly

## 2022-06-08 NOTE — Assessment & Plan Note (Signed)
Continue metformin 500 mg twice daily Avoid sugar sweets soda States that he has his diabetic eye exam done about 2 months ago We will obtain results from his ophthalmologist office

## 2022-06-08 NOTE — Progress Notes (Signed)
Thomas Lindsey     MRN: [id number]      DOB: 1956/08/02   HPI Mr. Stickels with past medical history of hypertension, PAD, AAA without rupture, controlled diabetes type 2 with complication is here for follow up for hypertension .    Hypertension .currently taking amlodipine 10 mg daily, lisinopril 5 mg daily, metoprolol 100 mg daily, spironolactone 50 mg twicw daily.  Lisinopril was added to his current blood pressure medications during his last visit. denies edema, dizziness, headache, adverse reactions to current medications.     Patient complains of chronic intermittent right-sided abdominal cramps, stated that his cramps have not been bad but he just wanted to mention it to me. denies N/V, blood y stools , constipation . 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  Patient stated that he has had various tests including ultrasound done in the past and he was told that his cramps was most likely muscular skeletal dysfunction.  Takes meloxicam, Flexeril as needed.   Due for Tdap, shingles, pneumonia vaccines need for all vaccines discussed with patient patient encouraged to get vaccines at his pharmacy.   ROS Denies recent fever or chills. Denies sinus pressure, nasal congestion, ear pain or sore throat. Denies chest congestion, productive cough or wheezing. Denies chest pains, palpitations and leg swelling Denies abdominal pain, nausea, vomiting,diarrhea or constipation.   Denies dysuria, frequency, hesitancy or incontinence. Denies headaches, seizures, numbness, or tingling. Denies depression, anxiety or insomnia. Denies skin break down or rash.   PE  BP 128/77 (BP Location: Right Arm, Patient Position: Sitting, Cuff Size: Large)   Pulse 76   Ht 6\' 2"  (1.88 m)   Wt 293 lb (132.9 kg)   SpO2 94%   BMI 37.62 kg/m   Patient alert and oriented and in no cardiopulmonary distress.  Chest: Clear to auscultation bilaterally.  CVS: S1, S2 no  murmurs, no S3.Regular rate.  ABD: Soft non tender.   Ext: No edema  MS: Adequate ROM spine, shoulders, hips and knees.  Skin: Intact, no ulcerations or rash noted.  Psych: Good eye contact, normal affect. Memory intact not anxious or depressed appearing.  CNS: CN 2-12 intact, power,  normal throughout.no focal deficits noted.   Assessment & Plan  Obesity Wt Readings from Last 3 Encounters:  06/08/22 293 lb (132.9 kg)  04/27/22 294 lb (133.4 kg)  04/06/22 292 lb 12.8 oz (132.8 kg)  Stated that he has been eating a lot of vegetables. Patient counseled on carb modified diet encouraged to engage in regular moderate exercises at least for 50 minutes weekly  Hypertension BP Readings from Last 3 Encounters:  06/08/22 128/77  04/27/22 (!) 145/78  04/06/22 (!) 154/74  Chronic condition well-controlled on lisinopril 5 mg daily, metoprolol 100 mg daily, spironolactone 50 mg twice daily, amlodipine 10 mg daily. Continue current medications DASH diet advised engage in regular exercises at least 150 minutes weekly BMP today Follow-up in 4 months  Controlled diabetes mellitus type 2 with complications (HCC) Continue metformin 500 mg twice daily Avoid sugar sweets soda States that he has his diabetic eye exam done about 2 months ago We will obtain results from his ophthalmologist office  Hyperlipidemia LDL goal is less than 55 due to history of PAD  currently on atorvastatin 40 mg daily Check fasting lipid panel  Muscle cramps Chronic condition  Continue Flexeril 5 mg twice daily as needed Engage in regular exercises patient encouraged to  maintain proper hydration

## 2022-06-08 NOTE — Patient Instructions (Addendum)
Please get your TDAP vaccine, shingles vaccine and pneumonia vaccine at your pharmacy   Please get your fasting labs done as discussed   It is important that you exercise regularly at least 30 minutes 5 times a week.  Think about what you will eat, plan ahead. Choose " clean, green, fresh or frozen" over canned, processed or packaged foods which are more sugary, salty and fatty. 70 to 75% of food eaten should be vegetables and fruit. Three meals at set times with snacks allowed between meals, but they must be fruit or vegetables. Aim to eat over a 12 hour period , example 7 am to 7 pm, and STOP after  your last meal of the day. Drink water,generally about 64 ounces per day, no other drink is as healthy. Fruit juice is best enjoyed in a healthy way, by EATING the fruit.  Thanks for choosing [facility], we consider it a privelige to serve you.

## 2022-06-08 NOTE — Assessment & Plan Note (Addendum)
BP Readings from Last 3 Encounters:  06/08/22 128/77  04/27/22 (!) 145/78  04/06/22 (!) 154/74  Chronic condition well-controlled on lisinopril 5 mg daily, metoprolol 100 mg daily, spironolactone 50 mg twice daily, amlodipine 10 mg daily. Continue current medications DASH diet advised engage in regular exercises at least 150 minutes weekly BMP today Follow-up in 4 months

## 2022-06-08 NOTE — Assessment & Plan Note (Addendum)
LDL goal is less than 55 due to history of PAD  currently on atorvastatin 40 mg daily Check fasting lipid panel

## 2022-06-15 ENCOUNTER — Other Ambulatory Visit: Payer: Self-pay | Admitting: Nurse Practitioner

## 2022-06-15 DIAGNOSIS — E782 Mixed hyperlipidemia: Secondary | ICD-10-CM

## 2022-06-15 LAB — BASIC METABOLIC PANEL
BUN/Creatinine Ratio: 25 — ABNORMAL HIGH (ref 10–24)
BUN: 23 mg/dL (ref 8–27)
CO2: 22 mmol/L (ref 20–29)
Calcium: 9.9 mg/dL (ref 8.6–10.2)
Chloride: 102 mmol/L (ref 96–106)
Creatinine, Ser: 0.93 mg/dL (ref 0.76–1.27)
Glucose: 120 mg/dL — ABNORMAL HIGH (ref 70–99)
Potassium: 5.2 mmol/L (ref 3.5–5.2)
Sodium: 140 mmol/L (ref 134–144)
eGFR: 91 mL/min/{1.73_m2} (ref >59)

## 2022-06-15 LAB — LIPID PANEL
Chol/HDL Ratio: 3.4 ratio (ref 0.0–5.0)
Cholesterol, Total: 162 mg/dL (ref 100–199)
HDL: 47 mg/dL (ref >39)
LDL Chol Calc (NIH): 83 mg/dL (ref 0–99)
Triglycerides: 191 mg/dL — ABNORMAL HIGH (ref 0–149)
VLDL Cholesterol Cal: 32 mg/dL (ref 5–40)

## 2022-06-15 MED ORDER — ATORVASTATIN CALCIUM 80 MG PO TABS: 80.0000 mg | ORAL_TABLET | Freq: Every day | ORAL | 3 refills | Status: DC

## 2022-06-15 NOTE — Progress Notes (Signed)
LDL not at goal of less than 55  due to his PAD . Start atorvastatin 80mg  daily , follow up in 8 weeks, fasting labs 3-5 days before visit , avoid fatty fried foods   Kidney function is stable

## 2022-06-17 ENCOUNTER — Other Ambulatory Visit: Payer: Self-pay

## 2022-06-17 ENCOUNTER — Other Ambulatory Visit: Payer: Self-pay | Admitting: Nurse Practitioner

## 2022-06-17 ENCOUNTER — Telehealth: Payer: Self-pay | Admitting: Nurse Practitioner

## 2022-06-17 MED ORDER — ZOLPIDEM TARTRATE 10 MG PO TABS: 10.0000 mg | ORAL_TABLET | Freq: Every day | ORAL | 0 refills | Status: DC

## 2022-06-17 NOTE — Telephone Encounter (Signed)
Patient needs refill on  zolpidem (AMBIEN) 10 MG tablet   [facility] mail in [place]

## 2022-06-18 IMAGING — CT CT ABDOMEN W/ CM
3 of 10 series · 9 of 46 positions shown, 16 images · IV contrast (agent unspecified)
Comparison: None.

CLINICAL DATA: Intermittent right lower quadrant abdominal pain

EXAM:
CT ABDOMEN WITH CONTRAST
TECHNIQUE: Multidetector CT imaging of the abdomen was performed using the
standard protocol following bolus administration of intravenous
contrast.

[Series 3: axial st · axial · 0.95mm/px · z∈[+931,+1121]mm · 5 of 58 slices shown, 10 images]
[im 10/58  soft-tissue]
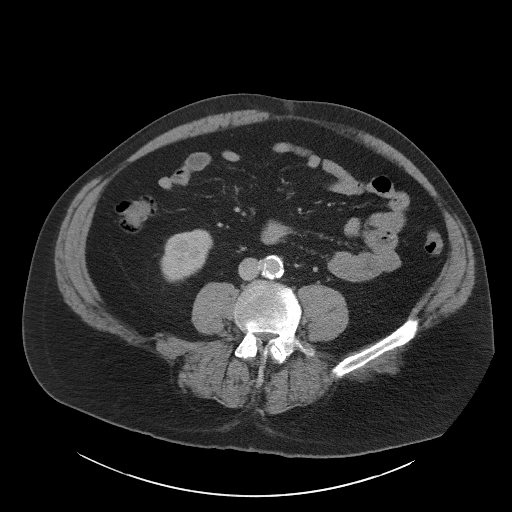
[im 10/58  bone]
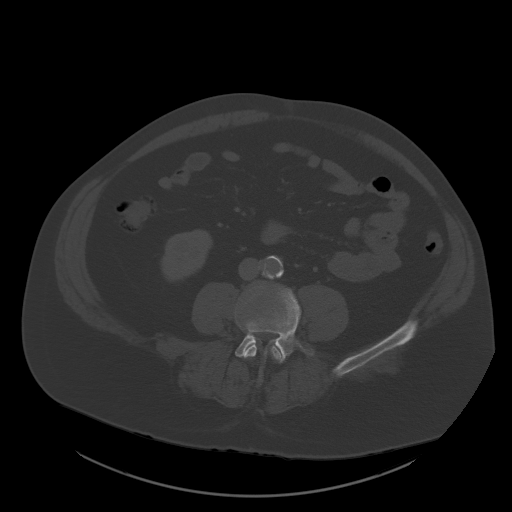
[im 20/58  soft-tissue]
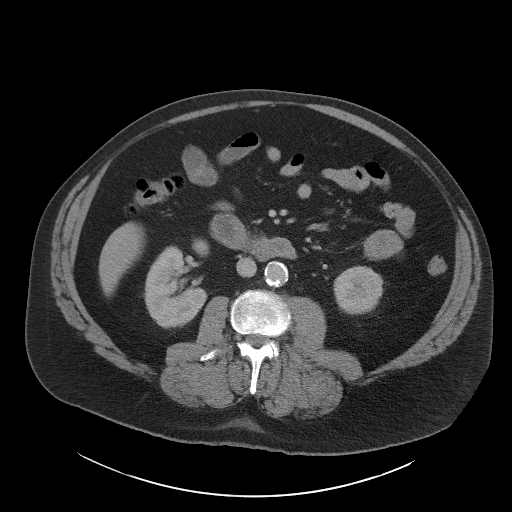
[im 20/58  lung]
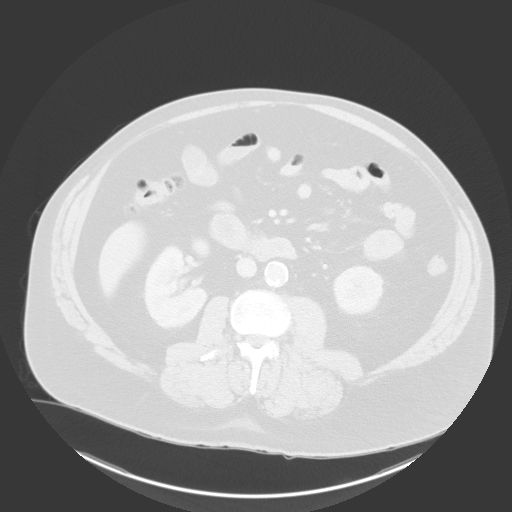
[im 29/58  soft-tissue]
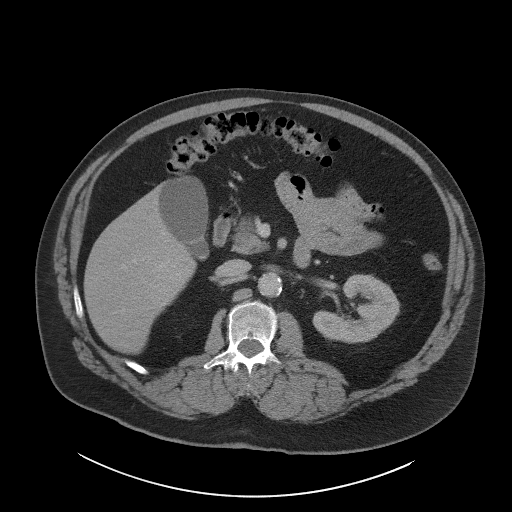
[im 29/58  lung]
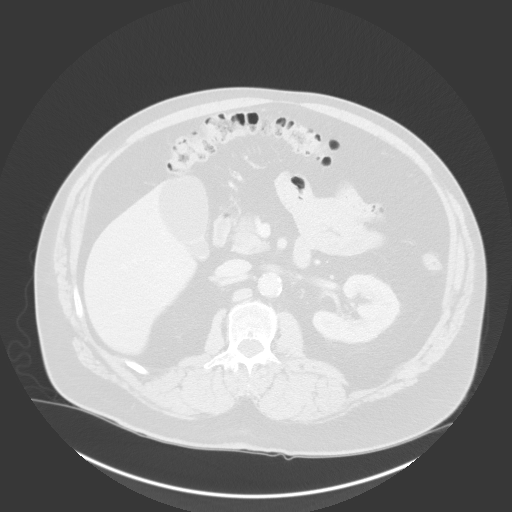
[im 39/58  soft-tissue]
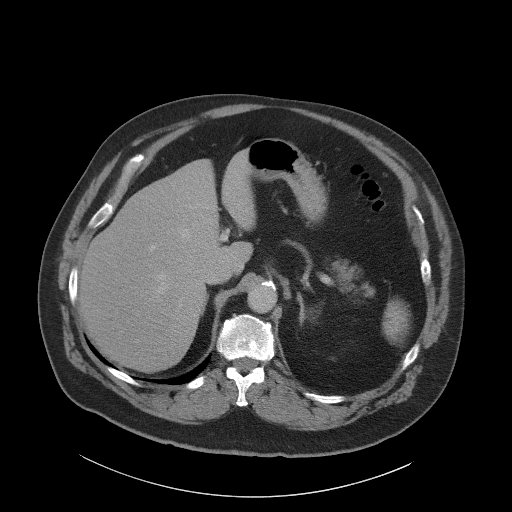
[im 39/58  lung]
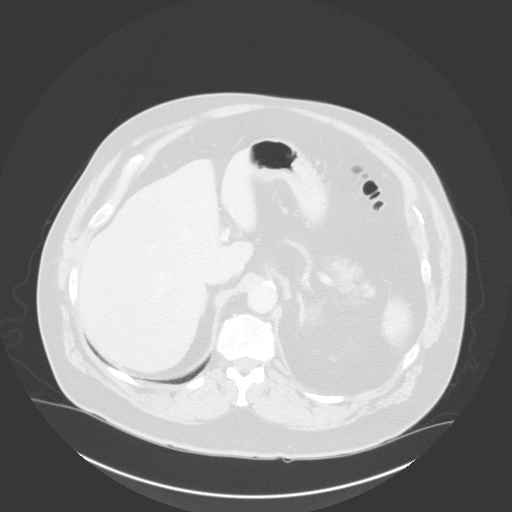
[im 48/58  soft-tissue]
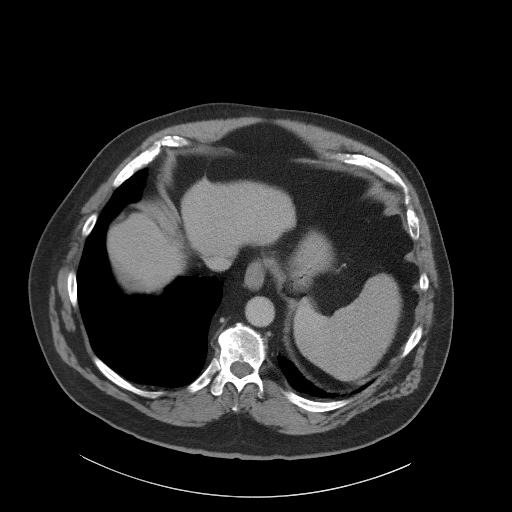
[im 48/58  lung]
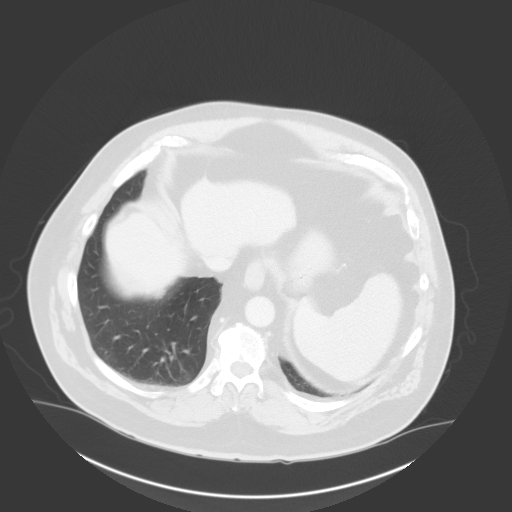

[Series 6: coronal st · coronal · 0.70mm/px · 3 of 126 slices shown, 4 images]
[im 32/126  soft-tissue]
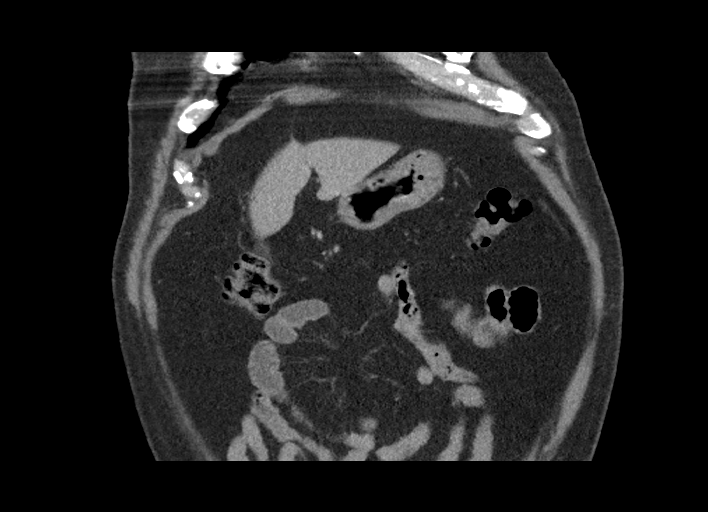
[im 63/126  soft-tissue]
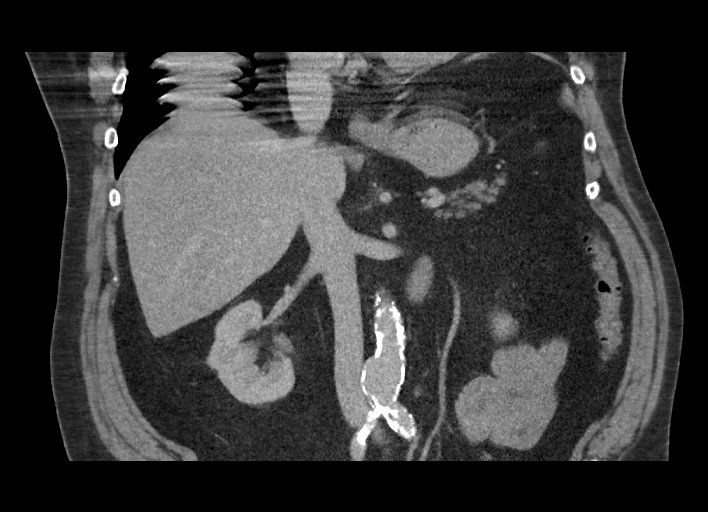
[im 63/126  bone]
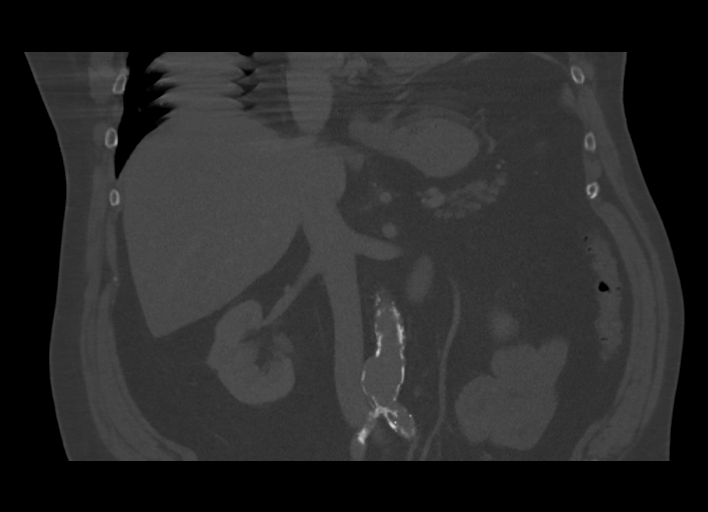
[im 94/126  soft-tissue]
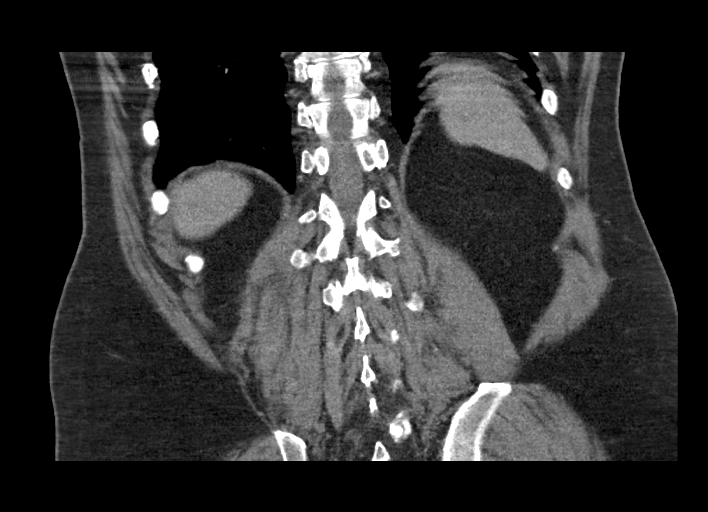

[Series 7: sagittal st · sagittal · 0.65mm/px · 1 of 153 slices shown, 2 images]
[im 47/153  soft-tissue]
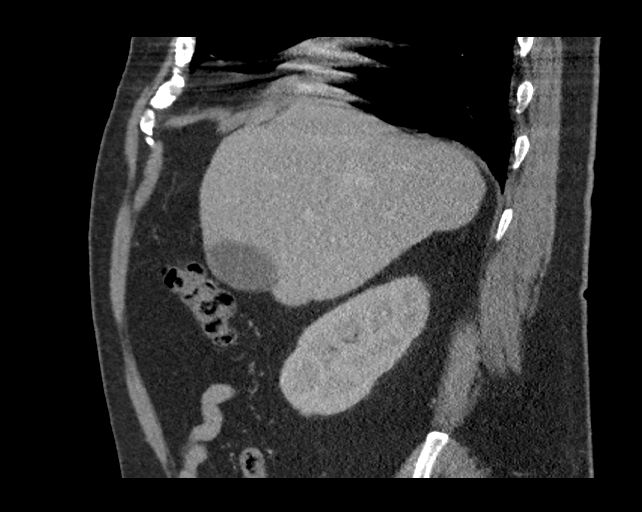
[im 47/153  bone]
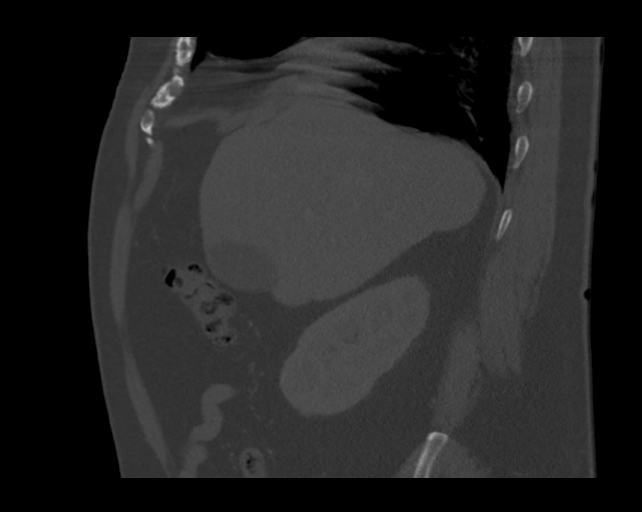

[9 of 46 positions shown; findings below may reference images not displayed]

RADIATION DOSE REDUCTION: This exam was performed according to the
departmental dose-optimization program which includes automated
exposure control, adjustment of the mA and/or kV according to
patient size and/or use of iterative reconstruction technique.

CONTRAST:  100mL OMNIPAQUE IOHEXOL 300 MG/ML  SOLN
FINDINGS: Lower chest: The lung bases are clear of acute process. No pleural
effusion or pulmonary lesions. The heart is normal in size. No
pericardial effusion. Age advanced aortic and coronary artery
calcifications. The distal esophagus and aorta are unremarkable.

Hepatobiliary: No hepatic lesions or intrahepatic biliary
dilatation. Possible small layering gallstone in the gallbladder but
no findings for acute cholecystitis. Normal caliber common bile
duct.

Pancreas: No mass, inflammation or ductal dilatation. Mild diffuse
fatty change except in the posterior pancreatic head and uncinate
process.

Spleen: Normal size. No focal lesions. There is a small accessory
spleen noted between the spleen in the pancreatic tail.

Adrenals/Urinary Tract: The adrenal glands are normal.

No renal calculi or renal lesions. No collecting system
abnormalities on the delayed images.

Stomach/Bowel: The stomach, duodenum, visualized small bowel and
visualized colon are unremarkable.

Vascular/Lymphatic: Significant age advanced atherosclerotic
calcifications involving the aorta and branch vessel ostia.
Bilateral iliac artery stents are noted. No abdominal
lymphadenopathy the.

Other: No ascites. Small periumbilical abdominal wall hernia
containing fat.

Musculoskeletal: No significant bony findings.
IMPRESSION: 1. No acute abdominal findings, mass lesions or adenopathy.
2. Cholelithiasis but no CT findings for acute cholecystitis.
3. Significant age advanced atherosclerotic calcifications involving
the aorta and branch vessel ostia. Bilateral iliac artery stents are
noted.

Aortic Atherosclerosis (D1MYX-MH3.3).

## 2022-06-22 ENCOUNTER — Telehealth: Payer: Self-pay

## 2022-06-22 NOTE — Telephone Encounter (Signed)
Patient calling about sleep study has not heard back from anyone and it has been quite a while. Please give patient a return call [phone].

## 2022-06-23 NOTE — Telephone Encounter (Signed)
Community message sent to [place] study has already been resulted

## 2022-06-29 NOTE — Telephone Encounter (Signed)
handled

## 2022-07-12 ENCOUNTER — Other Ambulatory Visit: Payer: Self-pay | Admitting: Nurse Practitioner

## 2022-07-16 ENCOUNTER — Encounter (HOSPITAL_COMMUNITY): Payer: Self-pay | Admitting: *Deleted

## 2022-07-16 ENCOUNTER — Emergency Department (HOSPITAL_COMMUNITY)
Admission: EM | Admit: 2022-07-16 | Discharge: 2022-07-17 | Disposition: A | Discharge status: Home / Self Care | Payer: Medicare Other | Attending: Emergency Medicine | Admitting: Emergency Medicine

## 2022-07-16 ENCOUNTER — Emergency Department (HOSPITAL_COMMUNITY): Payer: Medicare Other

## 2022-07-16 ENCOUNTER — Other Ambulatory Visit: Payer: Self-pay

## 2022-07-16 DIAGNOSIS — R109 Unspecified abdominal pain: Secondary | ICD-10-CM | POA: Diagnosis present

## 2022-07-16 DIAGNOSIS — Z7902 Long term (current) use of antithrombotics/antiplatelets: Secondary | ICD-10-CM | POA: Insufficient documentation

## 2022-07-16 DIAGNOSIS — Z79899 Other long term (current) drug therapy: Secondary | ICD-10-CM | POA: Diagnosis not present

## 2022-07-16 DIAGNOSIS — D72829 Elevated white blood cell count, unspecified: Secondary | ICD-10-CM | POA: Diagnosis not present

## 2022-07-16 DIAGNOSIS — Z7984 Long term (current) use of oral hypoglycemic drugs: Secondary | ICD-10-CM | POA: Insufficient documentation

## 2022-07-16 DIAGNOSIS — K8050 Calculus of bile duct without cholangitis or cholecystitis without obstruction: Secondary | ICD-10-CM

## 2022-07-16 DIAGNOSIS — E1165 Type 2 diabetes mellitus with hyperglycemia: Secondary | ICD-10-CM | POA: Diagnosis not present

## 2022-07-16 DIAGNOSIS — K8020 Calculus of gallbladder without cholecystitis without obstruction: Secondary | ICD-10-CM | POA: Insufficient documentation

## 2022-07-16 DIAGNOSIS — I10 Essential (primary) hypertension: Secondary | ICD-10-CM | POA: Diagnosis not present

## 2022-07-16 LAB — CBC WITH DIFFERENTIAL/PLATELET
Abs Immature Granulocytes: 0.07 10*3/uL (ref 0.00–0.07)
Basophils Absolute: 0.1 10*3/uL (ref 0.0–0.1)
Basophils Relative: 0 %
Eosinophils Absolute: 0.2 10*3/uL (ref 0.0–0.5)
Eosinophils Relative: 1 %
HCT: 40.4 % (ref 39.0–52.0)
Hemoglobin: 13.7 g/dL (ref 13.0–17.0)
Immature Granulocytes: 1 %
Lymphocytes Relative: 15 %
Lymphs Abs: 1.8 10*3/uL (ref 0.7–4.0)
MCH: 30.2 pg (ref 26.0–34.0)
MCHC: 33.9 g/dL (ref 30.0–36.0)
MCV: 89.2 fL (ref 80.0–100.0)
Monocytes Absolute: 0.7 10*3/uL (ref 0.1–1.0)
Monocytes Relative: 6 %
Neutro Abs: 9.7 10*3/uL — ABNORMAL HIGH (ref 1.7–7.7)
Neutrophils Relative %: 77 %
Platelets: 264 10*3/uL (ref 150–400)
RBC: 4.53 MIL/uL (ref 4.22–5.81)
RDW: 12.9 % (ref 11.5–15.5)
WBC: 12.5 10*3/uL — ABNORMAL HIGH (ref 4.0–10.5)
nRBC: 0 % (ref 0.0–0.2)

## 2022-07-16 LAB — TROPONIN I (HIGH SENSITIVITY): Troponin I (High Sensitivity): 2 ng/L (ref <18)

## 2022-07-16 LAB — COMPREHENSIVE METABOLIC PANEL
ALT: 26 U/L (ref 0–44)
AST: 19 U/L (ref 15–41)
Albumin: 4.5 g/dL (ref 3.5–5.0)
Alkaline Phosphatase: 49 U/L (ref 38–126)
Anion gap: 9 (ref 5–15)
BUN: 25 mg/dL — ABNORMAL HIGH (ref 8–23)
CO2: 25 mmol/L (ref 22–32)
Calcium: 9.5 mg/dL (ref 8.9–10.3)
Chloride: 103 mmol/L (ref 98–111)
Creatinine, Ser: 0.96 mg/dL (ref 0.61–1.24)
GFR, Estimated: >60 mL/min (ref >60)
Glucose, Bld: 233 mg/dL — ABNORMAL HIGH (ref 70–99)
Potassium: 4.1 mmol/L (ref 3.5–5.1)
Sodium: 137 mmol/L (ref 135–145)
Total Bilirubin: 0.5 mg/dL (ref 0.3–1.2)
Total Protein: 7.8 g/dL (ref 6.5–8.1)

## 2022-07-16 LAB — LIPASE, BLOOD: Lipase: 29 U/L (ref 11–51)

## 2022-07-16 MED ORDER — SODIUM CHLORIDE 0.9 % IV BOLUS
1000.0000 mL | Freq: Once | INTRAVENOUS | Status: AC
Start: 2022-07-16 — End: 2022-07-16
  Administered 2022-07-16: 1000 mL via INTRAVENOUS

## 2022-07-16 MED ORDER — MORPHINE SULFATE (PF) 4 MG/ML IV SOLN
4.0000 mg | Freq: Once | INTRAVENOUS | Status: AC
  Administered 2022-07-16: 4 mg via INTRAVENOUS
  Filled 2022-07-16: qty 1

## 2022-07-16 MED ORDER — ONDANSETRON HCL 4 MG/2ML IJ SOLN
4.0000 mg | Freq: Once | INTRAMUSCULAR | Status: AC
Start: 2022-07-16 — End: 2022-07-16
  Administered 2022-07-16: 4 mg via INTRAVENOUS
  Filled 2022-07-16: qty 2

## 2022-07-16 MED ORDER — IOHEXOL 300 MG/ML  SOLN
100.0000 mL | Freq: Once | INTRAMUSCULAR | Status: AC | PRN
  Administered 2022-07-16: 100 mL via INTRAVENOUS

## 2022-07-16 MED ORDER — SODIUM CHLORIDE 0.9 % IV SOLN: INTRAVENOUS | Status: DC

## 2022-07-16 NOTE — ED Triage Notes (Signed)
Pt with sudden upper abd pain about an hour ago with diaphoresis.  Denies any N/V.  Mild SOB

## 2022-07-16 NOTE — ED Provider Notes (Signed)
[facility] EMERGENCY DEPARTMENT Provider Note   CSN: [id number] Arrival date & time: 07/16/22  2139     History  Chief Complaint  Patient presents with   Abdominal Pain    Thomas Lindsey is a 66 y.o. male.   Abdominal Pain    Patient has a history of hypertension, saccular aneurysm, diabetes, GERD, gallstones who presents to the ED with complaints of abdominal pain.  Patient states he was at home this evening when he suddenly had pain in his upper abdomen.  He became diaphoretic and nauseated.  Patient denied any vomiting.  He is not having any chest pain.  He denies any shortness of breath.  Patient denies any blood in his stool.  No fevers or chills.  He denies any history of any prior abdominal surgeries.  Home Medications Prior to Admission medications   Medication Sig Start Date End Date Taking? Authorizing Provider  amLODipine (NORVASC) 10 MG tablet Take 1 tablet (10 mg total) by mouth daily. 05/24/22   Donell Beers, FNP  atorvastatin (LIPITOR) 80 MG tablet Take 1 tablet (80 mg total) by mouth daily. 06/15/22   Donell Beers, FNP  cholecalciferol (VITAMIN D3) 25 MCG (1000 UNIT) tablet Take 1,000 Units by mouth daily. Patient not taking: Reported on 05/30/2022    [provider]  clopidogrel (PLAVIX) 75 MG tablet Take 1 tablet (75 mg total) by mouth daily. 05/20/22   Paseda, Baird Kay, FNP  cyclobenzaprine (FLEXERIL) 5 MG tablet Take 5 mg by mouth 2 (two) times daily as needed for muscle spasms. 10/27/20   [provider]  diclofenac Sodium (VOLTAREN) 1 % GEL Apply 1 application. topically daily as needed (pain).    [provider]  glucose blood (ACCU-CHEK GUIDE) test strip Check blood sugar once daily. 02/22/22   Donell Beers, FNP  hydrOXYzine (ATARAX) 25 MG tablet Take 25 mg by mouth 2 (two) times daily. 01/31/22   [provider]  lisinopril (ZESTRIL) 5 MG tablet Take 1 tablet (5 mg total) by mouth daily. 04/27/22    Donell Beers, FNP  meloxicam (MOBIC) 15 MG tablet Take 15 mg by mouth daily. 11/11/21   [provider]  metFORMIN (GLUCOPHAGE) 500 MG tablet Take 500 mg by mouth 2 (two) times daily with a meal. 10/20/20   [provider]  metoprolol succinate (TOPROL-XL) 100 MG 24 hr tablet Take 1 tablet (100 mg total) by mouth daily. Take with or immediately following a meal. 04/27/22   Paseda, Baird Kay, FNP  Omega-3 Fatty Acids (FISH OIL) 1000 MG CAPS Take 1,000 mg by mouth daily.    [provider]  omeprazole (PRILOSEC) 20 MG capsule Take 20 mg by mouth daily. 12/11/21   [provider]  pantoprazole (PROTONIX) 40 MG tablet TAKE 1 TABLET DAILY 07/13/22   Donell Beers, FNP  spironolactone (ALDACTONE) 50 MG tablet Take 50 mg by mouth 2 (two) times daily. 10/21/20   [provider]  vitamin C (ASCORBIC ACID) 500 MG tablet Take 500 mg by mouth daily.    [provider]  zolpidem (AMBIEN) 10 MG tablet Take 1 tablet (10 mg total) by mouth at bedtime. 06/17/22   Donell Beers, FNP      Allergies    Hctz [hydrochlorothiazide]    Review of Systems   Review of Systems  Gastrointestinal:  Positive for abdominal pain.    Physical Exam Updated Vital Signs BP (!) 141/124 (BP Location: Right Arm)  Pulse 84   Temp (!) 97.5 F (36.4 C) (Oral)   Resp 13   Ht 1.88 m (6\' 2" )   Wt 133.8 kg   SpO2 99%   BMI 37.88 kg/m  Physical Exam Vitals and nursing note reviewed.  Constitutional:      Appearance: He is well-developed. He is not diaphoretic.  HENT:     Head: Normocephalic and atraumatic.     Right Ear: External ear normal.     Left Ear: External ear normal.  Eyes:     General: No scleral icterus.       Right eye: No discharge.        Left eye: No discharge.     Conjunctiva/sclera: Conjunctivae normal.  Neck:     Trachea: No tracheal deviation.  Cardiovascular:     Rate and Rhythm: Normal rate and regular rhythm.  Pulmonary:      Effort: Pulmonary effort is normal. No respiratory distress.     Breath sounds: Normal breath sounds. No stridor. No wheezing or rales.  Abdominal:     General: Bowel sounds are increased. There is no distension.     Palpations: Abdomen is soft.     Tenderness: There is abdominal tenderness in the epigastric area. There is no guarding or rebound.  Musculoskeletal:        General: No tenderness or deformity.     Cervical back: Neck supple.  Skin:    General: Skin is warm and dry.     Findings: No rash.  Neurological:     General: No focal deficit present.     Mental Status: He is alert.     Cranial Nerves: No cranial nerve deficit (no facial droop, extraocular movements intact, no slurred speech).     Sensory: No sensory deficit.     Motor: No abnormal muscle tone or seizure activity.     Coordination: Coordination normal.  Psychiatric:        Mood and Affect: Mood normal.     ED Results / Procedures / Treatments   Labs (all labs ordered are listed, but only abnormal results are displayed) Labs Reviewed  COMPREHENSIVE METABOLIC PANEL  LIPASE, BLOOD  CBC WITH DIFFERENTIAL/PLATELET  URINALYSIS, ROUTINE W REFLEX MICROSCOPIC    EKG EKG Interpretation  Date/Time:  Saturday July 16 2022 21:56:40 EDT Ventricular Rate:  77 PR Interval:  174 QRS Duration: 97 QT Interval:  389 QTC Calculation: 441 R Axis:   49 Text Interpretation: Sinus rhythm No significant change since last tracing Confirmed by 07-30-1975 [phone]) on 07/16/2022 10:14:10 PM  Radiology No results found.  Procedures Procedures    Medications Ordered in ED Medications  sodium chloride 0.9 % bolus 1,000 mL (has no administration in time range)    And  0.9 %  sodium chloride infusion (has no administration in time range)  morphine (PF) 4 MG/ML injection 4 mg (has no administration in time range)  ondansetron (ZOFRAN) injection 4 mg (has no administration in time range)    ED Course/ Medical Decision  Making/ A&P Clinical Course as of 07/16/22 2348  Sat Jul 16, 2022  2347 CBC with Diff(!) Leukocytosis noted [JK]  2347 Comprehensive metabolic panel(!) Hyperglycemia [JK]  2348 Troponin I (High Sensitivity) Normal [JK]  2348 Lipase, blood Normal [JK]    Clinical Course User Index [JK] 2349, MD  Medical Decision Making Differential diagnosis includes cholecystitis, abdominal aortic aneurysm, pancreatitis, bowel obstruction  Amount and/or Complexity of Data Reviewed Labs: ordered. Radiology: ordered.  Risk Prescription drug management.   Patient with acute abdominal pain.  Initial EKG reassuring.  Doubt cardiac etiology.  Troponin is normal.  Patient does have upper abdominal tenderness.  Outpatient imaging test reviewed and patient did have gallstones noted on the CT scan before.  Ultrasounds not available at this time is night.  We will proceed with CT scan for further evaluation.  Care turned over to Dr. Rubin Payor        Final Clinical Impression(s) / ED Diagnoses Final diagnoses:  None    Rx / DC Orders ED Discharge Orders     None         Linwood Dibbles, MD 07/16/22 2349

## 2022-07-17 DIAGNOSIS — K8020 Calculus of gallbladder without cholecystitis without obstruction: Secondary | ICD-10-CM | POA: Diagnosis not present

## 2022-07-17 LAB — URINALYSIS, ROUTINE W REFLEX MICROSCOPIC
Bacteria, UA: NONE SEEN
Bilirubin Urine: NEGATIVE
Glucose, UA: >=500 mg/dL — AB
Hgb urine dipstick: NEGATIVE
Ketones, ur: NEGATIVE mg/dL
Leukocytes,Ua: NEGATIVE
Nitrite: NEGATIVE
Protein, ur: NEGATIVE mg/dL
Specific Gravity, Urine: 1.032 — ABNORMAL HIGH (ref 1.005–1.030)
pH: 5 (ref 5.0–8.0)

## 2022-07-17 LAB — TROPONIN I (HIGH SENSITIVITY): Troponin I (High Sensitivity): <2 ng/L (ref <18)

## 2022-07-18 ENCOUNTER — Other Ambulatory Visit: Payer: Self-pay | Admitting: Nurse Practitioner

## 2022-08-06 LAB — BASIC METABOLIC PANEL
BUN/Creatinine Ratio: 22 (ref 10–24)
BUN: 23 mg/dL (ref 8–27)
CO2: 23 mmol/L (ref 20–29)
Calcium: 10.1 mg/dL (ref 8.6–10.2)
Chloride: 97 mmol/L (ref 96–106)
Creatinine, Ser: 1.03 mg/dL (ref 0.76–1.27)
Glucose: 136 mg/dL — ABNORMAL HIGH (ref 70–99)
Potassium: 5.1 mmol/L (ref 3.5–5.2)
Sodium: 136 mmol/L (ref 134–144)
eGFR: 80 mL/min/{1.73_m2} (ref >59)

## 2022-08-06 LAB — LIPID PANEL
Chol/HDL Ratio: 3.6 ratio (ref 0.0–5.0)
Cholesterol, Total: 167 mg/dL (ref 100–199)
HDL: 47 mg/dL (ref >39)
LDL Chol Calc (NIH): 80 mg/dL (ref 0–99)
Triglycerides: 242 mg/dL — ABNORMAL HIGH (ref 0–149)
VLDL Cholesterol Cal: 40 mg/dL (ref 5–40)

## 2022-08-08 ENCOUNTER — Other Ambulatory Visit: Payer: Self-pay | Admitting: Nurse Practitioner

## 2022-08-09 NOTE — Progress Notes (Signed)
Will review labs at his upcoming appointment

## 2022-08-11 ENCOUNTER — Ambulatory Visit (INDEPENDENT_AMBULATORY_CARE_PROVIDER_SITE_OTHER): Payer: Medicare Other | Admitting: Nurse Practitioner

## 2022-08-11 ENCOUNTER — Encounter: Payer: Self-pay | Admitting: Nurse Practitioner

## 2022-08-11 VITALS — BP 131/60 | HR 72 | Ht 74.0 in | Wt 289.0 lb

## 2022-08-11 DIAGNOSIS — I10 Essential (primary) hypertension: Secondary | ICD-10-CM

## 2022-08-11 DIAGNOSIS — B351 Tinea unguium: Secondary | ICD-10-CM

## 2022-08-11 DIAGNOSIS — Z23 Encounter for immunization: Secondary | ICD-10-CM | POA: Diagnosis not present

## 2022-08-11 DIAGNOSIS — E782 Mixed hyperlipidemia: Secondary | ICD-10-CM | POA: Diagnosis not present

## 2022-08-11 MED ORDER — EZETIMIBE 10 MG PO TABS: 10.0000 mg | ORAL_TABLET | Freq: Every day | ORAL | 1 refills | Status: DC

## 2022-08-11 MED ORDER — EFINACONAZOLE 10 % EX SOLN: CUTANEOUS | 1 refills | Status: DC

## 2022-08-11 NOTE — Patient Instructions (Addendum)
Please start taking zetia 10mg  daily , continue atorvastatin 80mg  daily , avoid fatty fried foods.   Apply efinaconazole 10% solution to affected nail once daily for 48 weeks    It is important that you exercise regularly at least 30 minutes 5 times a week.  Think about what you will eat, plan ahead. Choose " clean, green, fresh or frozen" over canned, processed or packaged foods which are more sugary, salty and fatty. 70 to 75% of food eaten should be vegetables and fruit. Three meals at set times with snacks allowed between meals, but they must be fruit or vegetables. Aim to eat over a 12 hour period , example 7 am to 7 pm, and STOP after  your last meal of the day. Drink water,generally about 64 ounces per day, no other drink is as healthy. Fruit juice is best enjoyed in a healthy way, by EATING the fruit.  Thanks for choosing [facility], we consider it a privelige to serve you.

## 2022-08-11 NOTE — Assessment & Plan Note (Addendum)
Lab Results  Component Value Date   CHOL 167 08/05/2022   HDL 47 08/05/2022   LDLCALC 80 08/05/2022   TRIG 242 (H) 08/05/2022   CHOLHDL 3.6 08/05/2022  on atorvastatin 80mg  daily ldl goal is less than 55, has history of PAD Start zetia 10mg  daily , continue atorvastatin 80mg  daily  Avoid fatty fried foods

## 2022-08-11 NOTE — Assessment & Plan Note (Signed)
Patient educated on CDC recommendation for the vaccine. Verbal consent was obtained from the patient, vaccine administered by nurse, no sign of adverse reactions noted at this time. Patient education on arm soreness and use of tylenol  for this patient  was discussed. Patient educated on the signs and symptoms of adverse effect and advise to contact the office if they occur.  ?

## 2022-08-11 NOTE — Progress Notes (Addendum)
na  Thomas Lindsey     MRN: [id number]      DOB: Sep 07, 1956   HPI Thomas Lindsey with past medical history of hypertension, AAA, controlled type 2 diabetes with complications, hyperlipidemia is here for follow up for hyperlipidemia.  Hyperlipidemia.  Currently on atorvastatin 80 mg daily patient denies muscle aches.  Patient complains of left thumb nail discoloration for over 2 months.  Patient denies pain, trauma, fever      ROS Denies recent fever or chills. Denies sinus pressure, nasal congestion, ear pain or sore throat. Denies chest congestion, productive cough or wheezing. Denies chest pains, palpitations and leg swelling Denies abdominal pain, nausea, vomiting,diarrhea or constipation.   Denies dysuria, frequency, hesitancy or incontinence. Denies joint pain, swelling and limitation in mobility. Denies headaches, seizures, numbness, or tingling. Denies depression, anxiety or insomnia. Marland Kitchen   PE  BP 131/60 (BP Location: Left Arm, Patient Position: Sitting, Cuff Size: Large)   Pulse 72   Ht 6\' 2"  (1.88 m)   Wt 289 lb (131.1 kg)   SpO2 95%   BMI 37.11 kg/m   Patient alert and oriented and in no cardiopulmonary distress.  Chest: Clear to auscultation bilaterally.  CVS: S1, S2 no murmurs, no S3.Regular rate.  ABD: Soft non tender.   Ext: No edema  MS: Adequate ROM spine, shoulders, hips and knees.  Skin: Intact, no ulcerations or rash noted, left thumb nail is discolored and thickened on one side, no swelling or discharge noted  Psych: Good eye contact, normal affect. Memory intact not anxious or depressed appearing.  CNS: CN 2-12 intact, power,  normal throughout.no focal deficits noted.   Assessment & Plan  Hyperlipidemia Lab Results  Component Value Date   CHOL 167 08/05/2022   HDL 47 08/05/2022   LDLCALC 80 08/05/2022   TRIG 242 (H) 08/05/2022   CHOLHDL 3.6 08/05/2022  on atorvastatin 80mg  daily ldl goal is less than 55, has history of PAD Start  zetia 10mg  daily , continue atorvastatin 80mg  daily  Avoid fatty fried foods  Onychomycosis Rx efinaconazole 10% solution Apply to affected nail once daily for 48 weeks Patient told to call the office if he experiences any adverse reaction including skin irritation.  Need for immunization against influenza Patient educated on CDC recommendation for the vaccine. Verbal consent was obtained from the patient, vaccine administered by nurse, no sign of adverse reactions noted at this time. Patient education on arm soreness and use of tylenol  for this patient  was discussed. Patient educated on the signs and symptoms of adverse effect and advise to contact the office if they occur.

## 2022-08-11 NOTE — Assessment & Plan Note (Signed)
Rx efinaconazole 10% solution Apply to affected nail once daily for 48 weeks Patient told to call the office if he experiences any adverse reaction including skin irritation.

## 2022-08-14 ENCOUNTER — Other Ambulatory Visit: Payer: Self-pay | Admitting: Nurse Practitioner

## 2022-08-16 ENCOUNTER — Other Ambulatory Visit: Payer: Self-pay | Admitting: Nurse Practitioner

## 2022-08-16 MED ORDER — ZOLPIDEM TARTRATE 10 MG PO TABS
10.0000 mg | ORAL_TABLET | Freq: Every day | ORAL | 0 refills | Status: DC
Start: 2022-08-16 — End: 2022-09-26

## 2022-08-24 ENCOUNTER — Ambulatory Visit (INDEPENDENT_AMBULATORY_CARE_PROVIDER_SITE_OTHER): Payer: Medicare Other | Admitting: Pulmonary Disease

## 2022-08-24 ENCOUNTER — Encounter: Payer: Self-pay | Admitting: Pulmonary Disease

## 2022-08-24 DIAGNOSIS — G4733 Obstructive sleep apnea (adult) (pediatric): Secondary | ICD-10-CM | POA: Diagnosis not present

## 2022-08-24 DIAGNOSIS — Z9989 Dependence on other enabling machines and devices: Secondary | ICD-10-CM | POA: Diagnosis not present

## 2022-08-24 NOTE — Assessment & Plan Note (Signed)
CPAP download was reviewed which shows good control of events on 10 cm with residual AHI of 3/hour, and auto settings 5 to 10 cm.  He is very compliant without a single missed night and has minimal leak. CPAP is only helped improve his daytime somnolence and fatigue We will increase auto settings 5 to 12 cm  Weight loss encouraged, compliance with goal of at least 4-6 hrs every night is the expectation. Advised against medications with sedative side effects Cautioned against driving when sleepy - understanding that sleepiness will vary on a day to day basis

## 2022-08-24 NOTE — Progress Notes (Signed)
Subjective:    Patient ID: Thomas Lindsey, male    DOB: [birth year], 66 y.o.   MRN: [id number]  HPI  66 year old presents to establish care for OSA. He has moved from [facility] to [place] 2 years ago He is a retired Paramedic  PMH -hypertension, diabetes, coronary calcification, PAD on Plavix. He smoked 1 to 2 packs/day until he quit in his early 12s, more than 40 pack years  OSA was diagnosed several years ago and he was maintained on CPAP with a fullface mask with good improvement in his daytime somnolence and fatigue.  He still reports some insomnia and has to take an Ambien every night.  He was provided with a new CPAP machine set to auto settings 5 to 10 cm with [facility] a few months ago after a repeat home sleep test which I reviewed. Epworth sleepiness score is 6. Bedtime is between 10 and 11 PM, sleep latency about 30 minutes, he starts on his right side and turns over on his back, reports 1-2 nocturnal awakenings including nocturia and is out of bed by 8 AM feeling rested without dryness of mouth or headaches. He has gained 20 pounds over the last 2 years.   Significant tests/ events reviewed  HST 05/2022 >> (snap) AHI 4% 13/hour lowest desaturation 82%, 71 minutes with saturation less than 88%  CT chest without contrast 12/2021 -pericardial calcification, coronary calcifications, no nodules   Past Medical History:  Diagnosis Date   Aortic disorder (HCC)    Arthritis    Cervical radiculitis    Depression    Diabetes mellitus without complication (HCC)    Elevated glucose    GERD (gastroesophageal reflux disease)    Hypertension    Insomnia    Obstructive sleep apnea    Peripheral arterial disease (HCC)    Saccular aneurysm    Tinnitus of both ears     Past Surgical History:  Procedure Laterality Date   BACK SURGERY     STENT PLACEMENT ILIAC ([facility] HX) Bilateral    placed aproximately 17 years    Allergies  Allergen  Reactions   Hctz [Hydrochlorothiazide] Other (See Comments)    Dizzy    Social History   Socioeconomic History   Marital status: Married    Spouse name: Not on file   Number of children: 3   Years of education: Not on file   Highest education level: Not on file  Occupational History   Not on file  Tobacco Use   Smoking status: Former    Types: Cigarettes    Quit date: 06/14/2007    Years since quitting: 15.2   Smokeless tobacco: Never  Vaping Use   Vaping Use: Never used  Substance and Sexual Activity   Alcohol use: Not Currently    Comment: Occasional   Drug use: Not Currently   Sexual activity: Not on file  Other Topics Concern   Not on file  Social History Narrative   Lives with his wife    Social Determinants of Health   Financial Resource Strain: Not on file  Food Insecurity: Not on file  Transportation Needs: Not on file  Physical Activity: Not on file  Stress: Not on file  Social Connections: Not on file  Intimate Partner Violence: Not on file    Family History  Problem Relation Age of Onset   Heart disease Father    Hypertension Father    Hyperlipidemia Father    Kidney disease Father  Review of Systems  Constitutional: negative for anorexia, fevers and sweats  Eyes: negative for irritation, redness and visual disturbance  Ears, nose, mouth, throat, and face: negative for earaches, epistaxis, nasal congestion and sore throat  Respiratory: negative for cough, dyspnea on exertion, sputum and wheezing  Cardiovascular: negative for chest pain, dyspnea, lower extremity edema, orthopnea, palpitations and syncope  Gastrointestinal: negative for abdominal pain, constipation, diarrhea, melena, nausea and vomiting  Genitourinary:negative for dysuria, frequency and hematuria  Hematologic/lymphatic: negative for bleeding, easy bruising and lymphadenopathy  Musculoskeletal:negative for arthralgias, muscle weakness and stiff joints  Neurological: negative  for coordination problems, gait problems, headaches and weakness  Endocrine: negative for diabetic symptoms including polydipsia, polyuria and weight loss     Objective:   Physical Exam   Gen. Pleasant, obese, in no distress, normal affect ENT - no pallor,icterus, no post nasal drip, class 2-3 airway Neck: No JVD, no thyromegaly, no carotid bruits Lungs: no use of accessory muscles, no dullness to percussion, decreased without rales or rhonchi  Cardiovascular: Rhythm regular, heart sounds  normal, no murmurs or gallops, no peripheral edema Abdomen: soft and non-tender, no hepatosplenomegaly, BS normal. Musculoskeletal: No deformities, no cyanosis or clubbing Neuro:  alert, non focal, no tremors      Assessment & Plan:    Coronary artery disease -I reviewed CT chest which shows coronary calcification including left main.  This was a nongated study so difficult to interpret.  He clearly has risk factors and peripheral arterial disease so definitely has coronary artery disease.  Suggested he discuss with PCP about referral to cardiology.  Aggressive risk control

## 2022-08-24 NOTE — Patient Instructions (Signed)
  X Increase auto CPAP 5-12 cm

## 2022-09-06 ENCOUNTER — Ambulatory Visit (INDEPENDENT_AMBULATORY_CARE_PROVIDER_SITE_OTHER): Payer: Medicare Other | Admitting: General Surgery

## 2022-09-06 ENCOUNTER — Encounter: Payer: Self-pay | Admitting: General Surgery

## 2022-09-06 VITALS — BP 137/88 | HR 70 | Temp 97.7°F | Resp 14 | Ht 74.0 in | Wt 290.0 lb

## 2022-09-06 DIAGNOSIS — K8020 Calculus of gallbladder without cholecystitis without obstruction: Secondary | ICD-10-CM

## 2022-09-07 NOTE — Progress Notes (Signed)
Thomas Lindsey; [id number]; 08-28-1956   HPI Patient is a 66 year old white male who was referred to my care by Dr. Durwin Nora in the emergency room for evaluation treatment of biliary colic secondary to cholelithiasis.  The patient was seen in the emergency room on 07/16/2022 for an episode of epigastric and right upper quadrant abdominal pain and nausea.  This lasted about 3 hours.  His liver enzyme tests were all within normal limits.  CT scan of the abdomen revealed cholelithiasis without evidence of acute cholecystitis.  Since that time, the patient has not had any recurrence of his symptoms.  He denies any fatty food intolerance, fever, chills, or jaundice.  This was his first episode. Past Medical History:  Diagnosis Date   Aortic disorder (HCC)    Arthritis    Cervical radiculitis    Depression    Diabetes mellitus without complication (HCC)    Elevated glucose    GERD (gastroesophageal reflux disease)    Hypertension    Insomnia    Obstructive sleep apnea    Peripheral arterial disease (HCC)    Saccular aneurysm    Tinnitus of both ears     Past Surgical History:  Procedure Laterality Date   BACK SURGERY     STENT PLACEMENT ILIAC ([facility] HX) Bilateral    placed aproximately 17 years    Family History  Problem Relation Age of Onset   Heart disease Father    Hypertension Father    Hyperlipidemia Father    Kidney disease Father     Current Outpatient Medications on File Prior to Visit  Medication Sig Dispense Refill   amLODipine (NORVASC) 10 MG tablet Take 1 tablet (10 mg total) by mouth daily. 90 tablet 1   atorvastatin (LIPITOR) 80 MG tablet Take 1 tablet (80 mg total) by mouth daily. 90 tablet 3   cholecalciferol (VITAMIN D3) 25 MCG (1000 UNIT) tablet Take 1,000 Units by mouth daily.     clopidogrel (PLAVIX) 75 MG tablet Take 1 tablet (75 mg total) by mouth daily. 90 tablet 1   cyclobenzaprine (FLEXERIL) 5 MG tablet Take 5 mg by mouth 2 (two) times daily as needed for  muscle spasms.     diclofenac Sodium (VOLTAREN) 1 % GEL Apply 1 application. topically daily as needed (pain).     Efinaconazole 10 % SOLN Apply to affected nail once daily for 48 weeks 8 mL 1   ezetimibe (ZETIA) 10 MG tablet Take 1 tablet (10 mg total) by mouth daily. 90 tablet 1   glucose blood (ACCU-CHEK GUIDE) test strip Check blood sugar once daily. 100 each 1   hydrOXYzine (ATARAX) 25 MG tablet Take 25 mg by mouth 2 (two) times daily.     lisinopril (ZESTRIL) 5 MG tablet Take 1 tablet (5 mg total) by mouth daily. 90 tablet 3   meloxicam (MOBIC) 15 MG tablet Take 15 mg by mouth daily.     metFORMIN (GLUCOPHAGE) 500 MG tablet Take 500 mg by mouth 2 (two) times daily with a meal.     metoprolol succinate (TOPROL-XL) 100 MG 24 hr tablet Take 1 tablet (100 mg total) by mouth daily. Take with or immediately following a meal. 90 tablet 3   Omega-3 Fatty Acids (FISH OIL) 1000 MG CAPS Take 1,000 mg by mouth daily.     omeprazole (PRILOSEC) 20 MG capsule Take 20 mg by mouth daily.     pantoprazole (PROTONIX) 40 MG tablet TAKE 1 TABLET DAILY 90 tablet 0   spironolactone (  ALDACTONE) 50 MG tablet Take 50 mg by mouth 2 (two) times daily.     vitamin C (ASCORBIC ACID) 500 MG tablet Take 500 mg by mouth daily.     zolpidem (AMBIEN) 10 MG tablet Take 1 tablet (10 mg total) by mouth at bedtime. 30 tablet 0   No current facility-administered medications on file prior to visit.    Allergies  Allergen Reactions   Hctz [Hydrochlorothiazide] Other (See Comments)    Dizzy    Social History   Substance and Sexual Activity  Alcohol Use Not Currently   Comment: Occasional    Social History   Tobacco Use  Smoking Status Former   Types: Cigarettes   Quit date: 06/14/2007   Years since quitting: 15.2  Smokeless Tobacco Never    Review of Systems  Constitutional: Negative.   HENT:  Positive for ear pain.   Eyes: Negative.   Respiratory:  Positive for shortness of breath.   Cardiovascular:  Negative.   Gastrointestinal:  Positive for abdominal pain.  Genitourinary: Negative.   Musculoskeletal:  Positive for back pain.  Skin: Negative.   Neurological:  Positive for tingling.  Endo/Heme/Allergies: Negative.   Psychiatric/Behavioral: Negative.      Objective   Vitals:   09/06/22 1318  BP: 137/88  Pulse: 70  Resp: 14  Temp: 97.7 F (36.5 C)  SpO2: 97%    Physical Exam Vitals reviewed.  Constitutional:      Appearance: Normal appearance. He is not ill-appearing.  HENT:     Head: Normocephalic and atraumatic.  Eyes:     General: No scleral icterus. Cardiovascular:     Rate and Rhythm: Normal rate and regular rhythm.     Heart sounds: Normal heart sounds. No murmur heard.    No friction rub. No gallop.  Pulmonary:     Effort: Pulmonary effort is normal. No respiratory distress.     Breath sounds: Normal breath sounds. No stridor. No wheezing, rhonchi or rales.  Abdominal:     General: Bowel sounds are normal. There is no distension.     Palpations: Abdomen is soft. There is no mass.     Tenderness: There is no abdominal tenderness. There is no guarding or rebound.     Hernia: No hernia is present.  Skin:    General: Skin is warm and dry.  Neurological:     Mental Status: He is alert and oriented to person, place, and time.    ER note reviewed Assessment  Biliary colic secondary to cholelithiasis, currently asymptomatic.  This apparently was the patient's first episode. Plan  As this was the patient's first episode, he would like to delay any surgical intervention at the present time.  I am fine with this.  He does realize that there is up to 50% chance over the next 2 years that he will have a recurrent episode.  If he does, he was instructed to return to my care or go the emergency room.  Literature was given.  Follow-up here as needed.

## 2022-09-14 ENCOUNTER — Other Ambulatory Visit: Payer: Self-pay | Admitting: Nurse Practitioner

## 2022-09-23 ENCOUNTER — Telehealth: Payer: Self-pay | Admitting: Internal Medicine

## 2022-09-23 ENCOUNTER — Other Ambulatory Visit: Payer: Self-pay

## 2022-09-23 ENCOUNTER — Other Ambulatory Visit: Payer: Self-pay | Admitting: Nurse Practitioner

## 2022-09-23 MED ORDER — SPIRONOLACTONE 50 MG PO TABS: 50.0000 mg | ORAL_TABLET | Freq: Two times a day (BID) | ORAL | 1 refills | Status: DC

## 2022-09-23 NOTE — Telephone Encounter (Signed)
Pt called stating he is out of his meds & needing a refill on spironolactone (ALDACTONE) 50 MG tablet and zolpidem (AMBIEN) 10 MG tablet. States he is needing a short supply sent to [facility] on [address] and the remaining refill to [facility]. Can you please refill?

## 2022-09-23 NOTE — Telephone Encounter (Signed)
Refilled spironolactone and scheduled visit for ambien.

## 2022-09-26 ENCOUNTER — Encounter: Payer: Self-pay | Admitting: Internal Medicine

## 2022-09-26 ENCOUNTER — Ambulatory Visit (INDEPENDENT_AMBULATORY_CARE_PROVIDER_SITE_OTHER): Payer: Medicare Other | Admitting: Internal Medicine

## 2022-09-26 DIAGNOSIS — G4700 Insomnia, unspecified: Secondary | ICD-10-CM | POA: Diagnosis not present

## 2022-09-26 MED ORDER — ZOLPIDEM TARTRATE 10 MG PO TABS: 10.0000 mg | ORAL_TABLET | Freq: Every day | ORAL | 0 refills | Status: DC

## 2022-09-26 NOTE — Progress Notes (Signed)
   Acute Telephone Visit  Virtual Visit via Telephone Note  I connected with Thomas Lindsey on 09/26/22 at 11:40 AM EDT by telephone and verified that I am speaking with the correct person using two identifiers.  Location: Patient: [address] [place], [place] [address] Provider: [phone] S. [address] [place], [place] [address]   I discussed the limitations, risks, security and privacy concerns of performing an evaluation and management service by telephone and the availability of in person appointments. I also discussed with the patient that there may be a patient responsible charge related to this service. The patient expressed understanding and agreed to proceed.   History of Present Illness:  Thomas Lindsey is seen today for an acute telephone encounter for medication refill.  He is requesting a refill of Ambien 10 mg nightly.  He has been prescribed this on a monthly basis for close to 2 years.  PDMP reviewed.  There is no history of high risk use or inappropriate refills.  Thomas Lindsey states that he has tried numerous alternatives, but nothing has been as effective as Ambien in helping him to sleep.  He is otherwise asymptomatic and without acute concerns today.   Assessment and Plan:  Insomnia Seen today for an acute telephone encounter for Ambien refill.  He has been taking Ambien for close to 2 years.  No prior history of inappropriate use. -Ambien 10 mg nightly x30 tablets refilled today.  He will see me for follow-up on 10/19/2022.   Follow Up Instructions:  I discussed the assessment and treatment plan with the patient. The patient was provided an opportunity to ask questions and all were answered. The patient agreed with the plan and demonstrated an understanding of the instructions.   The patient was advised to call back or seek an in-person evaluation if the symptoms worsen or if the condition fails to improve as anticipated.  I provided 5 minutes of non-face-to-face time during this  encounter.   Johnette Abraham, MD

## 2022-10-03 ENCOUNTER — Telehealth: Payer: Self-pay

## 2022-10-03 NOTE — Addendum Note (Signed)
Addended byIhor Dow on: 10/03/2022 02:52 PM   Modules accepted: Level of Service

## 2022-10-03 NOTE — Telephone Encounter (Signed)
Patient called said was not billed with AWV on date of service 06.26.2023. medicare did not pay for any of this visit.  He received a bill.

## 2022-10-04 ENCOUNTER — Telehealth: Payer: Self-pay | Admitting: Internal Medicine

## 2022-10-04 NOTE — Telephone Encounter (Signed)
Patient called about AWV billing for 05/30/22.  He was called by our office to schedule a AWV on 05/30/22 by Edwena Blow.  Edwena Blow was working on a list from [employer] that had Thomas Lindsey as needing a AWV so she called him and made the appt.  His insurance declined to pay for the visit.  He must have had a AWV at the office not in [facility] prior.  He said he would not have come had we not called him and told him he needed to do it.  He should not have to cover this bill since we called and encouraged him to do this.  He did not know what a AWV was and did not know he had one in the past.  I talked with Dr Posey Pronto and he has corrected this claim to a no charge visit for the patient.  It was the correct thing to do.

## 2022-10-12 ENCOUNTER — Ambulatory Visit: Payer: Medicare Other | Admitting: Nurse Practitioner

## 2022-10-19 ENCOUNTER — Encounter: Payer: Self-pay | Admitting: Internal Medicine

## 2022-10-19 ENCOUNTER — Ambulatory Visit (INDEPENDENT_AMBULATORY_CARE_PROVIDER_SITE_OTHER): Payer: Medicare Other | Admitting: Internal Medicine

## 2022-10-19 VITALS — BP 152/73 | HR 82 | Ht 74.0 in | Wt 287.8 lb

## 2022-10-19 DIAGNOSIS — I2510 Atherosclerotic heart disease of native coronary artery without angina pectoris: Secondary | ICD-10-CM

## 2022-10-19 DIAGNOSIS — I10 Essential (primary) hypertension: Secondary | ICD-10-CM | POA: Diagnosis not present

## 2022-10-19 DIAGNOSIS — E118 Type 2 diabetes mellitus with unspecified complications: Secondary | ICD-10-CM

## 2022-10-19 DIAGNOSIS — E782 Mixed hyperlipidemia: Secondary | ICD-10-CM | POA: Diagnosis not present

## 2022-10-19 DIAGNOSIS — I739 Peripheral vascular disease, unspecified: Secondary | ICD-10-CM | POA: Diagnosis not present

## 2022-10-19 DIAGNOSIS — I714 Abdominal aortic aneurysm, without rupture, unspecified: Secondary | ICD-10-CM

## 2022-10-19 MED ORDER — HYDROXYZINE HCL 25 MG PO TABS: 25.0000 mg | ORAL_TABLET | Freq: Two times a day (BID) | ORAL | 2 refills | Status: DC

## 2022-10-19 MED ORDER — METFORMIN HCL 500 MG PO TABS: 500.0000 mg | ORAL_TABLET | Freq: Two times a day (BID) | ORAL | 1 refills | Status: DC

## 2022-10-19 NOTE — Progress Notes (Unsigned)
Established Patient Office Visit  Subjective   Patient ID: Thomas Lindsey, male    DOB: 18-May-1956  Age: 66 y.o. MRN: [id number]  No chief complaint on file.  Thomas Lindsey returns to care today.  He is a 66 year old male with a past medical history significant for HTN, AAA, T2DM, OSA, and HLD.  He was last seen at [facility] on 9/7 by Thomas Rua, NP for routine follow-up.  Zetia 10 mg daily was started at that time for HLD.  2-monthfollow-up was arranged.  In the interim, he was seen by pulmonology to establish care for OSA.  He has also been seen by general surgery for evaluation of biliary colic secondary to cholelithiasis.  Surgical intervention has been deferred currently.  I have also spoken with him via telephone on 10/23 for medication refills.  Today ***  Past Medical History:  Diagnosis Date   Aortic disorder (HCC)    Arthritis    Cervical radiculitis    Depression    Diabetes mellitus without complication (HCC)    Elevated glucose    GERD (gastroesophageal reflux disease)    Hypertension    Insomnia    Obstructive sleep apnea    Peripheral arterial disease (HCC)    Saccular aneurysm    Tinnitus of both ears    Past Surgical History:  Procedure Laterality Date   BACK SURGERY     STENT PLACEMENT ILIAC ([facility]) Bilateral    placed aproximately 17 years   Social History   Tobacco Use   Smoking status: Former    Types: Cigarettes    Quit date: 06/14/2007    Years since quitting: 15.3   Smokeless tobacco: Never  Vaping Use   Vaping Use: Never used  Substance Use Topics   Alcohol use: Not Currently    Comment: Occasional   Drug use: Not Currently   Family History  Problem Relation Age of Onset   Heart disease Father    Hypertension Father    Hyperlipidemia Father    Kidney disease Father    Allergies  Allergen Reactions   Hctz [Hydrochlorothiazide] Other (See Comments)    Dizzy      ROS    Objective:     There were no vitals taken for this  visit. BP Readings from Last 3 Encounters:  09/06/22 137/88  08/24/22 132/86  08/11/22 131/60      Physical Exam   No results found for any visits on 10/19/22.  Last CBC Lab Results  Component Value Date   WBC 12.5 (H) 07/16/2022   HGB 13.7 07/16/2022   HCT 40.4 07/16/2022   MCV 89.2 07/16/2022   MCH 30.2 07/16/2022   RDW 12.9 07/16/2022   PLT 264 040/98/1191  Last metabolic panel Lab Results  Component Value Date   GLUCOSE 136 (H) 08/05/2022   NA 136 08/05/2022   K 5.1 08/05/2022   CL 97 08/05/2022   CO2 23 08/05/2022   BUN 23 08/05/2022   CREATININE 1.03 08/05/2022   EGFR 80 08/05/2022   CALCIUM 10.1 08/05/2022   PROT 7.8 07/16/2022   ALBUMIN 4.5 07/16/2022   LABGLOB 2.7 04/27/2022   AGRATIO 1.8 04/27/2022   BILITOT 0.5 07/16/2022   ALKPHOS 49 07/16/2022   AST 19 07/16/2022   ALT 26 07/16/2022   ANIONGAP 9 07/16/2022   Last lipids Lab Results  Component Value Date   CHOL 167 08/05/2022   HDL 47 08/05/2022   LDLCALC 80 08/05/2022   TRIG  242 (H) 08/05/2022   CHOLHDL 3.6 08/05/2022   Last hemoglobin A1c Lab Results  Component Value Date   HGBA1C 6.7 (H) 04/27/2022   The 10-year ASCVD risk score (Arnett DK, et al., 2019) is: 29.2%    Assessment & Plan:   Problem List Items Addressed This Visit   None   No follow-ups on file.    Thomas Abraham, MD

## 2022-10-19 NOTE — Patient Instructions (Signed)
It was a pleasure to see you today.  Thank you for giving [place] the opportunity to be involved in your care.  Below is a brief recap of your visit and next steps.  We will plan to see you again in 3 months.  Summary We will check your cholesterol again today I have referred you to cardiology to establish care Metformin and hydroxyzine were refilled We will follow up in 3 months

## 2022-10-20 DIAGNOSIS — I2510 Atherosclerotic heart disease of native coronary artery without angina pectoris: Secondary | ICD-10-CM | POA: Insufficient documentation

## 2022-10-20 LAB — LIPID PANEL
Chol/HDL Ratio: 2.6 ratio (ref 0.0–5.0)
Cholesterol, Total: 131 mg/dL (ref 100–199)
HDL: 51 mg/dL (ref >39)
LDL Chol Calc (NIH): 55 mg/dL (ref 0–99)
Triglycerides: 143 mg/dL (ref 0–149)
VLDL Cholesterol Cal: 25 mg/dL (ref 5–40)

## 2022-10-20 NOTE — Assessment & Plan Note (Addendum)
Lipid panel updated in September. Zetia 10 mg daily added for improved LDL control. He is also prescribed atorvastatin 80 mg daily and fish oil supplementation. -Repeat lipid panel ordered today

## 2022-10-20 NOTE — Assessment & Plan Note (Signed)
CT chest from January 2023 notable for CAD with left main and 3 vessel disease. The severity of disease is unable  to determined. He has not experienced any recent chest discomfort or pain. -Given his cardiac risk factors and significantly elevated 10-year ASCVD risk score, I have placed a referral to cardiology for Thomas Lindsey to establish care

## 2022-10-20 NOTE — Assessment & Plan Note (Signed)
Last A1c 6.7 in May. Well-controlled on metformin, which was refilled today.

## 2022-10-20 NOTE — Assessment & Plan Note (Signed)
BP is elevated today. He attributes this to having to wait an extended period of time to be seen today. Currently prescribed amlodipine 10 mg daily, lisinopril 5 mg daily, metoprolol succinate 100 mg daily, and aldactone 25 mg daily.  -No medication changes today. Continue current regimen.

## 2022-10-21 ENCOUNTER — Other Ambulatory Visit: Payer: Self-pay | Admitting: Nurse Practitioner

## 2022-10-26 ENCOUNTER — Telehealth: Payer: Self-pay | Admitting: Pulmonary Disease

## 2022-10-26 ENCOUNTER — Telehealth: Payer: Self-pay | Admitting: Internal Medicine

## 2022-10-26 ENCOUNTER — Other Ambulatory Visit: Payer: Self-pay

## 2022-10-26 DIAGNOSIS — G4700 Insomnia, unspecified: Secondary | ICD-10-CM

## 2022-10-26 MED ORDER — ZOLPIDEM TARTRATE 10 MG PO TABS: 10.0000 mg | ORAL_TABLET | Freq: Every day | ORAL | 0 refills | Status: DC

## 2022-10-26 NOTE — Telephone Encounter (Signed)
Pt called stating mail order phar has not received his medication called in today. Can you please resend?

## 2022-10-26 NOTE — Telephone Encounter (Signed)
Patient needs refills on    zolpidem (AMBIEN) 10 MG tablet    [facility] - [place], [place] - [facility] AT Portal to Registered [address] [place], [place] [address] Phone: [phone]  Fax: [phone]

## 2022-10-26 NOTE — Telephone Encounter (Signed)
Ov notes faxed. Nothing further needed

## 2022-10-31 ENCOUNTER — Other Ambulatory Visit: Payer: Self-pay

## 2022-10-31 ENCOUNTER — Telehealth: Payer: Self-pay | Admitting: Internal Medicine

## 2022-10-31 MED ORDER — SPIRONOLACTONE 50 MG PO TABS: 50.0000 mg | ORAL_TABLET | Freq: Two times a day (BID) | ORAL | 0 refills | Status: DC

## 2022-10-31 NOTE — Telephone Encounter (Signed)
  Prescription Request  10/31/2022  Is this a "Controlled Substance" medicine? No  LOV: 10/26/2022   What is the name of the medication or equipment? spironolactone (ALDACTONE) 50 MG tablet WANTS 90 DAY QTY (ALMOST OUT)  Have you contacted your pharmacy to request a refill? No   Which pharmacy would you like this sent to?  [facility] - [place], [place] - One [facility] AT Portal to Registered [facility] [address] [place] [address] Phone: [phone] Fax: [phone]  [id number] - [place], [place] - [address] AT [facility] OF [address] [place] [address] Phone: [phone] Fax: [phone]   Patient notified that their request is being sent to the clinical staff for review and that they should receive a response within 2 business days.   Please advise at [phone]

## 2022-11-06 ENCOUNTER — Other Ambulatory Visit: Payer: Self-pay | Admitting: Nurse Practitioner

## 2022-11-11 ENCOUNTER — Other Ambulatory Visit: Payer: Self-pay

## 2022-11-11 ENCOUNTER — Telehealth: Payer: Self-pay | Admitting: Internal Medicine

## 2022-11-11 DIAGNOSIS — E782 Mixed hyperlipidemia: Secondary | ICD-10-CM

## 2022-11-11 MED ORDER — EZETIMIBE 10 MG PO TABS: 10.0000 mg | ORAL_TABLET | Freq: Every day | ORAL | 1 refills | Status: DC

## 2022-11-11 NOTE — Telephone Encounter (Signed)
Patient needs refill on   ezetimibe (ZETIA) 10 MG tablet   [facility] [address] [place]

## 2022-11-11 NOTE — Telephone Encounter (Signed)
Patient aware refill has been sent 

## 2022-11-18 ENCOUNTER — Other Ambulatory Visit: Payer: Self-pay | Admitting: Internal Medicine

## 2022-11-18 DIAGNOSIS — G4700 Insomnia, unspecified: Secondary | ICD-10-CM

## 2022-12-22 ENCOUNTER — Encounter: Payer: Self-pay | Admitting: Internal Medicine

## 2022-12-22 ENCOUNTER — Ambulatory Visit: Payer: Medicare Other | Attending: Internal Medicine | Admitting: Internal Medicine

## 2022-12-22 VITALS — BP 128/78 | HR 81 | Ht 74.0 in | Wt 290.0 lb

## 2022-12-22 DIAGNOSIS — I2584 Coronary atherosclerosis due to calcified coronary lesion: Secondary | ICD-10-CM | POA: Diagnosis not present

## 2022-12-22 DIAGNOSIS — E1165 Type 2 diabetes mellitus with hyperglycemia: Secondary | ICD-10-CM | POA: Insufficient documentation

## 2022-12-22 DIAGNOSIS — Z136 Encounter for screening for cardiovascular disorders: Secondary | ICD-10-CM | POA: Diagnosis not present

## 2022-12-22 DIAGNOSIS — Z8679 Personal history of other diseases of the circulatory system: Secondary | ICD-10-CM | POA: Insufficient documentation

## 2022-12-22 DIAGNOSIS — I10 Essential (primary) hypertension: Secondary | ICD-10-CM | POA: Diagnosis not present

## 2022-12-22 NOTE — Patient Instructions (Signed)
Medication Instructions:  Your physician recommends that you continue on your current medications as directed. Please refer to the Current Medication list given to you today.   Labwork: None  Testing/Procedures: Cardiac Calcium Score  Follow-Up: Follow up with Dr. Dellia Cloud in 6 months.   Any Other Special Instructions Will Be Listed Below (If Applicable).  You have been referred to Nutrition. They will contact you with your first appointment.    If you need a refill on your cardiac medications before your next appointment, please call your pharmacy.

## 2022-12-22 NOTE — Progress Notes (Signed)
Cardiology Office Note  Date: 12/22/2022   ID: Thomas Lindsey, DOB 1956/04/18, MRN [id number]  PCP:  Johnette Abraham, MD  Cardiologist:  None Electrophysiologist:  None   Reason for Office Visit: Evaluation of coronary artery calcifications at the request of Dr. Doren Custard   History of Present Illness: Thomas Lindsey is a 67 y.o. male known to have HTN, DM 2, AAA 3 cm (stable but 04/2022 ultrasound aorta), PAD s/p bilateral common iliac artery stenting, minimal bilateral carotid artery stenosis (1 to 39% bilaterally in 04/2022) was referred to cardiology clinic for coronary artery calcifications.  Patient underwent multiple CAT scans of his chest and abdomen in the last 1 year which showed calcification/atherosclerosis of left main, LAD and LCx coronary vessels. The severity of calcifications was not quantified. He is currently on aspirin 81 mg once daily and atorvastatin 80 mg nightly. He is also taking Plavix 75 mg since his common iliac artery stenting and has not stopped since. He is here for new patient visit.  He denies any angina, dizziness, lightness, syncope, palpitations and claudication.  However he reported having SOB sometimes with daily or strenuous activities but looks like it is not bothering him much. No leg swelling. Denies smoking cigarettes, alcohol use and illicit drug abuse.  Past Medical History:  Diagnosis Date   Aortic disorder ([place])    Arthritis    Cervical radiculitis    Depression    Diabetes mellitus without complication (HCC)    Elevated glucose    GERD (gastroesophageal reflux disease)    Hypertension    Insomnia    Obstructive sleep apnea    Peripheral arterial disease (HCC)    Saccular aneurysm    Tinnitus of both ears     Past Surgical History:  Procedure Laterality Date   BACK SURGERY     STENT PLACEMENT ILIAC ([facility] HX) Bilateral    placed aproximately 17 years    Current Outpatient Medications  Medication Sig Dispense Refill   amLODipine  (NORVASC) 10 MG tablet TAKE 1 TABLET DAILY 90 tablet 1   atorvastatin (LIPITOR) 80 MG tablet Take 1 tablet (80 mg total) by mouth daily. 90 tablet 3   clopidogrel (PLAVIX) 75 MG tablet TAKE 1 TABLET DAILY 90 tablet 1   cyclobenzaprine (FLEXERIL) 5 MG tablet Take 5 mg by mouth 2 (two) times daily as needed for muscle spasms.     diclofenac Sodium (VOLTAREN) 1 % GEL Apply 1 application. topically daily as needed (pain).     ezetimibe (ZETIA) 10 MG tablet Take 1 tablet (10 mg total) by mouth daily. 90 tablet 1   glucose blood (ACCU-CHEK GUIDE) test strip Check blood sugar once daily. 100 each 1   hydrOXYzine (ATARAX) 25 MG tablet Take 1 tablet (25 mg total) by mouth 2 (two) times daily. 60 tablet 2   lisinopril (ZESTRIL) 5 MG tablet Take 1 tablet (5 mg total) by mouth daily. 90 tablet 3   meloxicam (MOBIC) 15 MG tablet Take 15 mg by mouth daily.     metFORMIN (GLUCOPHAGE) 500 MG tablet Take 1 tablet (500 mg total) by mouth 2 (two) times daily with a meal. 180 tablet 1   metoprolol succinate (TOPROL-XL) 100 MG 24 hr tablet Take 1 tablet (100 mg total) by mouth daily. Take with or immediately following a meal. 90 tablet 3   Omega-3 Fatty Acids (FISH OIL) 1000 MG CAPS Take 1,000 mg by mouth daily.     pantoprazole (PROTONIX) 40 MG tablet  TAKE 1 TABLET DAILY 90 tablet 0   spironolactone (ALDACTONE) 50 MG tablet Take 1 tablet (50 mg total) by mouth 2 (two) times daily. Take 50 mg by mouth 2 (two) times daily. 180 tablet 0   vitamin C (ASCORBIC ACID) 500 MG tablet Take 500 mg by mouth daily.     zolpidem (AMBIEN) 10 MG tablet TAKE 1 TABLET AT BEDTIME 30 tablet 0   No current facility-administered medications for this visit.   Allergies:  Hctz [hydrochlorothiazide]   Social History: The patient  reports that he quit smoking about 15 years ago. His smoking use included cigarettes. He has never used smokeless tobacco. He reports that he does not currently use alcohol. He reports that he does not currently  use drugs.   Family History: The patient's family history includes Heart disease in his father; Hyperlipidemia in his father; Hypertension in his father; Kidney disease in his father.   ROS:  Please see the history of present illness. Otherwise, complete review of systems is positive for none.  All other systems are reviewed and negative.   Physical Exam: VS:  BP 128/78   Pulse 81   Ht 6\' 2"  (1.88 m)   Wt 290 lb (131.5 kg)   SpO2 97%   BMI 37.23 kg/m , BMI Body mass index is 37.23 kg/m.  Wt Readings from Last 3 Encounters:  12/22/22 290 lb (131.5 kg)  10/19/22 287 lb 12.8 oz (130.5 kg)  09/06/22 290 lb (131.5 kg)    General: Patient appears comfortable at rest. HEENT: Conjunctiva and lids normal, oropharynx clear with moist mucosa. Neck: Supple, no elevated JVP or carotid bruits, no thyromegaly. Lungs: Clear to auscultation, nonlabored breathing at rest. Cardiac: Regular rate and rhythm, no S3 or significant systolic murmur, no pericardial rub. Abdomen: Soft, nontender, no hepatomegaly, bowel sounds present, no guarding or rebound. Extremities: No pitting edema, distal pulses 2+. Skin: Warm and dry. Musculoskeletal: No kyphosis. Neuropsychiatric: Alert and oriented x3, affect grossly appropriate.  ECG:  An ECG dated 07/16/2022 was personally reviewed today and demonstrated:  NSR and no ST-T changes  Recent Labwork: 07/16/2022: ALT 26; AST 19; Hemoglobin 13.7; Platelets 264 08/05/2022: BUN 23; Creatinine, Ser 1.03; Potassium 5.1; Sodium 136     Component Value Date/Time   CHOL 131 10/19/2022 1348   TRIG 143 10/19/2022 1348   HDL 51 10/19/2022 1348   CHOLHDL 2.6 10/19/2022 1348   LDLCALC 55 10/19/2022 1348    Other Studies Reviewed Today:   Assessment and Plan: Patient is a 67 year old male known to have HTN, DM 2, AAA 3 cm (stable but 04/2022 ultrasound aorta), PAD s/p bilateral common iliac artery stenting, minimal bilateral carotid artery stenosis (1 to 39% bilaterally  in 04/2022) was referred to cardiology clinic for coronary artery calcifications.  # Coronary artery calcifications -Patient underwent CT of chest and abdomen multiple times in the last 1 year which showed atherosclerosis/calcifications in the coronary vessels but the severity was not mentioned. Patient has no symptoms of angina or DOE or unusual tiredness or indigestion symptoms. In the absence of symptoms, I would not pursue CTA cardiac or NM stress testing at this point however he will benefit from CT calcium scoring for cardiac risk stratification. He is already on aspirin and high intensity statin but if score is more than 400, I will monitor him for any symptoms and perform NM stress test if necessary.  # HTN, controlled -Continue amlodipine 10 mg once daily -Continue lisinopril 5 mg once daily -  Continue metoprolol succinate 100 mg once daily -Continue spironolactone 50 mg twice daily -Management of HTN per PCP  # Lower extremity PAD s/p bilateral common iliac artery stenting, claudication free -Patient has been on DAPT, aspirin and Plavix since common iliac artery stenting. I do not think he will need Plavix but instructed him to check with vascular surgery about continuing Plavix. Otherwise continue aspirin and high intensity statin. -Quit smoking 15 years ago -Follow-up with vascular surgery  # AAA, 3.1 cm -Continue aspirin and high intensity statin -Quit smoking 15 years ago -Blood pressure controlled -Follow-up with vascular surgery  # HLD -Continue atorvastatin 80 mg nightly and ezetimibe 10 mg once daily  # DM2 -Continue metformin 500 mg twice daily and he would like to be referred to nutritionist to know dietary recommendations for diabetes management -Management of diabetes per PCP  I have spent a total of 45 minutes with patient reviewing chart, EKGs, labs and examining patient as well as establishing an assessment and plan that was discussed with the patient.  > 50% of  time was spent in direct patient care.     Medication Adjustments/Labs and Tests Ordered: Current medicines are reviewed at length with the patient today.  Concerns regarding medicines are outlined above.   Tests Ordered: No orders of the defined types were placed in this encounter.   Medication Changes: No orders of the defined types were placed in this encounter.   Disposition:  Follow up  6 months  Signed, Aldina Porta Fidel Levy, MD, 12/22/2022 9:01 AM    [facility] [address], [place], [place] [address]

## 2022-12-23 ENCOUNTER — Ambulatory Visit (HOSPITAL_COMMUNITY)
Admission: RE | Admit: 2022-12-23 | Discharge: 2022-12-23 | Disposition: A | Discharge status: Home / Self Care | Payer: Medicare Other | Source: Ambulatory Visit | Attending: Internal Medicine | Admitting: Internal Medicine

## 2022-12-23 DIAGNOSIS — Z136 Encounter for screening for cardiovascular disorders: Secondary | ICD-10-CM

## 2022-12-26 ENCOUNTER — Encounter: Payer: Self-pay | Admitting: Internal Medicine

## 2022-12-26 ENCOUNTER — Other Ambulatory Visit: Payer: Self-pay

## 2022-12-26 ENCOUNTER — Telehealth: Payer: Self-pay | Admitting: Internal Medicine

## 2022-12-26 DIAGNOSIS — E118 Type 2 diabetes mellitus with unspecified complications: Secondary | ICD-10-CM

## 2022-12-26 DIAGNOSIS — E782 Mixed hyperlipidemia: Secondary | ICD-10-CM

## 2022-12-26 DIAGNOSIS — I10 Essential (primary) hypertension: Secondary | ICD-10-CM

## 2022-12-26 DIAGNOSIS — G4700 Insomnia, unspecified: Secondary | ICD-10-CM

## 2022-12-26 MED ORDER — PANTOPRAZOLE SODIUM 40 MG PO TBEC: 40.0000 mg | DELAYED_RELEASE_TABLET | Freq: Every day | ORAL | 0 refills | Status: DC

## 2022-12-26 MED ORDER — METOPROLOL SUCCINATE ER 100 MG PO TB24: 100.0000 mg | ORAL_TABLET | Freq: Every day | ORAL | 3 refills | Status: DC

## 2022-12-26 MED ORDER — ATORVASTATIN CALCIUM 80 MG PO TABS: 80.0000 mg | ORAL_TABLET | Freq: Every day | ORAL | 3 refills | Status: DC

## 2022-12-26 MED ORDER — ACCU-CHEK GUIDE VI STRP: ORAL_STRIP | 1 refills | Status: DC

## 2022-12-26 MED ORDER — HYDROXYZINE HCL 25 MG PO TABS: 25.0000 mg | ORAL_TABLET | Freq: Two times a day (BID) | ORAL | 2 refills | Status: DC

## 2022-12-26 MED ORDER — ZOLPIDEM TARTRATE 10 MG PO TABS: 10.0000 mg | ORAL_TABLET | Freq: Every day | ORAL | 0 refills | Status: DC

## 2022-12-26 MED ORDER — SPIRONOLACTONE 50 MG PO TABS: 50.0000 mg | ORAL_TABLET | Freq: Two times a day (BID) | ORAL | 0 refills | Status: DC

## 2022-12-26 MED ORDER — CLOPIDOGREL BISULFATE 75 MG PO TABS: 75.0000 mg | ORAL_TABLET | Freq: Every day | ORAL | 1 refills | Status: DC

## 2022-12-26 MED ORDER — FISH OIL 1000 MG PO CAPS: 1000.0000 mg | ORAL_CAPSULE | Freq: Every day | ORAL | 3 refills | Status: DC

## 2022-12-26 MED ORDER — AMLODIPINE BESYLATE 10 MG PO TABS: 10.0000 mg | ORAL_TABLET | Freq: Every day | ORAL | 1 refills | Status: DC

## 2022-12-26 MED ORDER — VITAMIN C 500 MG PO TABS: 500.0000 mg | ORAL_TABLET | Freq: Every day | ORAL | 3 refills | Status: DC

## 2022-12-26 MED ORDER — LISINOPRIL 5 MG PO TABS: 5.0000 mg | ORAL_TABLET | Freq: Every day | ORAL | 3 refills | Status: DC

## 2022-12-26 MED ORDER — METFORMIN HCL 500 MG PO TABS: 500.0000 mg | ORAL_TABLET | Freq: Two times a day (BID) | ORAL | 1 refills | Status: DC

## 2022-12-26 NOTE — Telephone Encounter (Signed)
Pt phar [facility] changed to [facility] ph# [phone]. Wants to know if you can please refill zolpidem (AMBIEN) 10 MG tablet ? Also if you can please sedn refills on all his other meds except ezetimibe (ZETIA) 10 MG tablet  so that when they are due he can call them in?

## 2022-12-27 ENCOUNTER — Telehealth: Payer: Self-pay | Admitting: Internal Medicine

## 2022-12-27 DIAGNOSIS — G4700 Insomnia, unspecified: Secondary | ICD-10-CM

## 2022-12-27 MED ORDER — ZOLPIDEM TARTRATE 10 MG PO TABS: 10.0000 mg | ORAL_TABLET | Freq: Every day | ORAL | 0 refills | Status: DC

## 2022-12-27 NOTE — Telephone Encounter (Signed)
Patient needs refill on zolpidem (AMBIEN) 10 MG tablet     Pharmacy has changed to [facility] for all refills and new prescriptions. [facility]   Fax # [phone]

## 2022-12-30 ENCOUNTER — Other Ambulatory Visit: Payer: Self-pay

## 2022-12-30 DIAGNOSIS — I10 Essential (primary) hypertension: Secondary | ICD-10-CM

## 2022-12-30 DIAGNOSIS — E782 Mixed hyperlipidemia: Secondary | ICD-10-CM

## 2022-12-30 DIAGNOSIS — E118 Type 2 diabetes mellitus with unspecified complications: Secondary | ICD-10-CM

## 2022-12-30 MED ORDER — SPIRONOLACTONE 50 MG PO TABS: 50.0000 mg | ORAL_TABLET | Freq: Two times a day (BID) | ORAL | 0 refills | Status: DC

## 2022-12-30 MED ORDER — HYDROXYZINE HCL 25 MG PO TABS: 25.0000 mg | ORAL_TABLET | Freq: Two times a day (BID) | ORAL | 2 refills | Status: AC

## 2022-12-30 MED ORDER — PANTOPRAZOLE SODIUM 40 MG PO TBEC: 40.0000 mg | DELAYED_RELEASE_TABLET | Freq: Every day | ORAL | 0 refills | Status: DC

## 2022-12-30 MED ORDER — CYCLOBENZAPRINE HCL 5 MG PO TABS: 5.0000 mg | ORAL_TABLET | Freq: Two times a day (BID) | ORAL | 0 refills | Status: DC | PRN

## 2022-12-30 MED ORDER — METFORMIN HCL 500 MG PO TABS: 500.0000 mg | ORAL_TABLET | Freq: Two times a day (BID) | ORAL | 1 refills | Status: DC

## 2022-12-30 MED ORDER — ATORVASTATIN CALCIUM 80 MG PO TABS: 80.0000 mg | ORAL_TABLET | Freq: Every day | ORAL | 3 refills | Status: DC

## 2022-12-30 MED ORDER — LISINOPRIL 5 MG PO TABS: 5.0000 mg | ORAL_TABLET | Freq: Every day | ORAL | 3 refills | Status: DC

## 2022-12-30 MED ORDER — DICLOFENAC SODIUM 1 % EX GEL: 2.0000 g | Freq: Every day | CUTANEOUS | 3 refills | Status: DC | PRN

## 2022-12-30 MED ORDER — FISH OIL 1000 MG PO CAPS: 1000.0000 mg | ORAL_CAPSULE | Freq: Every day | ORAL | 3 refills | Status: DC

## 2022-12-30 MED ORDER — AMLODIPINE BESYLATE 10 MG PO TABS: 10.0000 mg | ORAL_TABLET | Freq: Every day | ORAL | 1 refills | Status: DC

## 2022-12-30 MED ORDER — MELOXICAM 15 MG PO TABS: 15.0000 mg | ORAL_TABLET | Freq: Every day | ORAL | 0 refills | Status: DC

## 2022-12-30 MED ORDER — METOPROLOL SUCCINATE ER 100 MG PO TB24: 100.0000 mg | ORAL_TABLET | Freq: Every day | ORAL | 3 refills | Status: DC

## 2022-12-30 MED ORDER — VITAMIN C 500 MG PO TABS: 500.0000 mg | ORAL_TABLET | Freq: Every day | ORAL | 3 refills | Status: AC

## 2022-12-30 MED ORDER — EZETIMIBE 10 MG PO TABS: 10.0000 mg | ORAL_TABLET | Freq: Every day | ORAL | 1 refills | Status: DC

## 2022-12-30 MED ORDER — ACCU-CHEK GUIDE VI STRP: ORAL_STRIP | 1 refills | Status: DC

## 2022-12-30 MED ORDER — CLOPIDOGREL BISULFATE 75 MG PO TABS: 75.0000 mg | ORAL_TABLET | Freq: Every day | ORAL | 1 refills | Status: DC

## 2023-01-17 ENCOUNTER — Encounter: Payer: Medicare Other | Attending: Internal Medicine | Admitting: Nutrition

## 2023-01-17 VITALS — Ht 74.0 in | Wt 288.0 lb

## 2023-01-17 DIAGNOSIS — E1165 Type 2 diabetes mellitus with hyperglycemia: Secondary | ICD-10-CM | POA: Diagnosis present

## 2023-01-17 DIAGNOSIS — I739 Peripheral vascular disease, unspecified: Secondary | ICD-10-CM

## 2023-01-17 DIAGNOSIS — E118 Type 2 diabetes mellitus with unspecified complications: Secondary | ICD-10-CM

## 2023-01-17 DIAGNOSIS — I2510 Atherosclerotic heart disease of native coronary artery without angina pectoris: Secondary | ICD-10-CM

## 2023-01-17 DIAGNOSIS — I10 Essential (primary) hypertension: Secondary | ICD-10-CM

## 2023-01-17 NOTE — Progress Notes (Signed)
Medical Nutrition Therapy  Appointment Start time:  [id number]  Appointment End time:  30  Primary concerns today: Dm Type 2  Referral diagnosis: E11.8 Preferred learning style: Read  Learning readiness: Ready    NUTRITION ASSESSMENT  67 yr old wmale here for DM Education. PMH: HTN, PVD, AAA, CAD, Hyperlipidemia. He wants to lose weight. A1C 6.9%. PCP Dr. Doren Custard. He would like to work on improving his eating habits to reverse his DM and reduce his medications. He is willing to work with Lifestyle Medicine for needed weight loss and improved overall health.  Medication for Dm: Metformin 500 mg BID. Has a meter and is testing. FBS 130-140's sometimes.  Current diet has been excessive in calories related to grazing, snacking and eating out fast food at times. He needs more fiber rich foods from whole plant based foods to help improve his CVD, obesity and DM.   Lab Results  Component Value Date   HGBA1C 6.9 (H) 01/20/2023      Latest Ref Rng & Units 08/05/2022    8:58 AM 07/16/2022   10:35 PM 06/14/2022    9:03 AM  CMP  Glucose 70 - 99 mg/dL 136  233  120   BUN 8 - 27 mg/dL 23  25  23    Creatinine 0.76 - 1.27 mg/dL 1.03  0.96  0.93   Sodium 134 - 144 mmol/L 136  137  140   Potassium 3.5 - 5.2 mmol/L 5.1  4.1  5.2   Chloride 96 - 106 mmol/L 97  103  102   CO2 20 - 29 mmol/L 23  25  22    Calcium 8.6 - 10.2 mg/dL 10.1  9.5  9.9   Total Protein 6.5 - 8.1 g/dL  7.8    Total Bilirubin 0.3 - 1.2 mg/dL  0.5    Alkaline Phos 38 - 126 U/L  49    AST 15 - 41 U/L  19    ALT 0 - 44 U/L  26     Lipid Panel     Component Value Date/Time   CHOL 131 10/19/2022 1348   TRIG 143 10/19/2022 1348   HDL 51 10/19/2022 1348   CHOLHDL 2.6 10/19/2022 1348   LDLCALC 55 10/19/2022 1348   LABVLDL 25 10/19/2022 1348    Anthropometrics  Wt Readings from Last 3 Encounters:  12/22/22 290 lb (131.5 kg)  10/19/22 287 lb 12.8 oz (130.5 kg)  09/06/22 290 lb (131.5 kg)   Ht Readings from Last 3 Encounters:   12/22/22 6\' 2"  (1.88 m)  10/19/22 6\' 2"  (1.88 m)  09/06/22 6\' 2"  (1.88 m)   There is no height or weight on file to calculate BMI. @BMIFA @ Facility age limit for growth %iles is 20 years. Facility age limit for growth %iles is 20 years.    Clinical Medical Hx: See Chart Medications: Metformin 500 mg BID. See chart Labs: A1C 6.9% Notable Signs/Symptoms: none  Lifestyle & Dietary Hx Married. Retired.  Estimated daily fluid: 40 oz Supplements: Vit C, Sleep: Takes ambien Stress / self-care: none Current average weekly physical activity: ADL  24-Hr Dietary Recall Usually eats 2-3 meals  per day. Likes to have a snack before bed or in evening. Grazes some during the day. Eats breakfast out sometimes.  Estimated Energy Needs Calories: 1600 Carbohydrate: 180g Protein: 120g Fat: 44g   NUTRITION DIAGNOSIS  NB-1.1 Food and nutrition-related knowledge deficit As related to Diabetes Type 2.  As evidenced by A1C 6.9%.   NUTRITION  INTERVENTION  Nutrition education (E-1) on the following topics:  Nutrition and Diabetes education provided on My Plate, CHO counting, meal planning, portion sizes, timing of meals, avoiding snacks between meals unless having a low blood sugar, target ranges for A1C and blood sugars, signs/symptoms and treatment of hyper/hypoglycemia, monitoring blood sugars, taking medications as prescribed, benefits of exercising 30 minutes per day and prevention of complications of DM.  Lifestyle Medicine  - Whole Food, Plant Predominant Nutrition is highly recommended: Eat Plenty of vegetables, Mushrooms, fruits, Legumes, Whole Grains, Nuts, seeds in lieu of processed meats, processed snacks/pastries red meat, poultry, eggs.    -It is better to avoid simple carbohydrates including: Cakes, Sweet Desserts, Ice Cream, Soda (diet and regular), Sweet Tea, Candies, Chips, Cookies, Store Bought Juices, Alcohol in Excess of  1-2 drinks a day, Lemonade,  Artificial Sweeteners,  Doughnuts, Coffee Creamers, "Sugar-free" Products, etc, etc.  This is not a complete list.....  Exercise: If you are able: 30 -60 minutes a day ,4 days a week, or 150 minutes a week.  The longer the better.  Combine stretch, strength, and aerobic activities.  If you were told in the past that you have high risk for cardiovascular diseases, you may seek evaluation by your heart doctor prior to initiating moderate to intense exercise programs.   Handouts Provided Include  Know your numbers Lifestyle Medicine  Learning Style & Readiness for Change Teaching method utilized: Visual & Auditory  Demonstrated degree of understanding via: Teach Back  Barriers to learning/adherence to lifestyle change: none  Goals Established by Pt Goals  Think about what  you eat, time of meals and portion Eat three meals per day at times discussed Cut down on high sodium foods Test blood sugars twice a day [employer]  or What the HEALTH or We are what we eat.   MONITORING & EVALUATION Dietary intake, weekly physical activity, and weight in 1 month.  Next Steps  Patient is to work on eating more whole plant based foods and cutting out processed foods.Marland Kitchen

## 2023-01-17 NOTE — Patient Instructions (Signed)
Goals  Think about what  you eat, time of meals and portion Eat three meals per day at times discussed Cut down on high sodium foods Test blood sugars twice a day [employer]  or What the HEALTH or We are what we eat.

## 2023-01-20 ENCOUNTER — Encounter: Payer: Self-pay | Admitting: Internal Medicine

## 2023-01-20 ENCOUNTER — Ambulatory Visit (INDEPENDENT_AMBULATORY_CARE_PROVIDER_SITE_OTHER): Payer: Medicare Other | Admitting: Internal Medicine

## 2023-01-20 VITALS — BP 136/74 | HR 85 | Ht 74.0 in | Wt 285.4 lb

## 2023-01-20 DIAGNOSIS — I739 Peripheral vascular disease, unspecified: Secondary | ICD-10-CM

## 2023-01-20 DIAGNOSIS — I2510 Atherosclerotic heart disease of native coronary artery without angina pectoris: Secondary | ICD-10-CM

## 2023-01-20 DIAGNOSIS — E118 Type 2 diabetes mellitus with unspecified complications: Secondary | ICD-10-CM

## 2023-01-20 DIAGNOSIS — I10 Essential (primary) hypertension: Secondary | ICD-10-CM | POA: Diagnosis not present

## 2023-01-20 DIAGNOSIS — E782 Mixed hyperlipidemia: Secondary | ICD-10-CM

## 2023-01-20 NOTE — Assessment & Plan Note (Signed)
CT calcium score 732.  Continues to deny chest pain and dyspnea on exertion.  He has establish care with cardiology. -Continue ASA, Plavix, and atorvastatin -HTN control -He plans to focus on making significant lifestyle modifications aimed at losing weight

## 2023-01-20 NOTE — Assessment & Plan Note (Signed)
A1c 6.7 in May 2023.  He is currently prescribed metformin 500 mg twice daily. -Repeat A1c ordered today

## 2023-01-20 NOTE — Progress Notes (Signed)
Established Patient Office Visit  Subjective   Patient ID: Thomas Lindsey, male    DOB: [birth year]  Age: 67 y.o. MRN: [id number]  Chief Complaint  Patient presents with   Hypertension    Follow up   [name] returns to care today.  He was last seen by me on 10/19/22.  No medication changes were made at that time.  He was referred to cardiology to establish care given his cardiac risk factors and significantly elevated ASCVD risk.  In the interim he was able to establish care with cardiology (Dr. Dellia Cloud) last month.  He subsequently underwent CT calcium scoring, which was 732.  There have otherwise been no acute interval events. [name] reports feeling well today.  He states that he has also establish care with a dietitian within the last week and has started making significant dietary changes.  He is asymptomatic and has no additional concerns to discuss today.  Past Medical History:  Diagnosis Date   Aortic disorder (HCC)    Arthritis    Cervical radiculitis    Depression    Diabetes mellitus without complication (HCC)    Elevated glucose    GERD (gastroesophageal reflux disease)    Hypertension    Insomnia    Obstructive sleep apnea    Peripheral arterial disease (HCC)    Saccular aneurysm    Tinnitus of both ears    Past Surgical History:  Procedure Laterality Date   BACK SURGERY     STENT PLACEMENT ILIAC ([place] HX) Bilateral    placed aproximately 17 years   Social History   Tobacco Use   Smoking status: Former    Types: Cigarettes    Quit date: 06/14/2007    Years since quitting: 15.6   Smokeless tobacco: Never  Vaping Use   Vaping Use: Never used  Substance Use Topics   Alcohol use: Not Currently    Comment: Occasional   Drug use: Not Currently   Family History  Problem Relation Age of Onset   Heart disease Father    Hypertension Father    Hyperlipidemia Father    Kidney disease Father    Allergies  Allergen Reactions   Hctz  [Hydrochlorothiazide] Other (See Comments)    Dizzy   Review of Systems  Constitutional:  Negative for chills and fever.  HENT:  Negative for sore throat.   Respiratory:  Negative for cough and shortness of breath.   Cardiovascular:  Negative for chest pain, palpitations and leg swelling.  Gastrointestinal:  Negative for abdominal pain, blood in stool, constipation, diarrhea, nausea and vomiting.  Genitourinary:  Negative for dysuria and hematuria.  Musculoskeletal:  Negative for myalgias.  Skin:  Negative for itching and rash.  Neurological:  Negative for dizziness and headaches.  Psychiatric/Behavioral:  Negative for depression and suicidal ideas.      Objective:     BP 136/74   Pulse 85   Ht 6' 2"$  (1.88 m)   Wt 285 lb 6.4 oz (129.5 kg)   SpO2 94%   BMI 36.64 kg/m  BP Readings from Last 3 Encounters:  01/20/23 136/74  12/22/22 128/78  10/19/22 (!) 152/73   Physical Exam Vitals reviewed.  Constitutional:      General: He is not in acute distress.    Appearance: Normal appearance. He is obese. He is not ill-appearing.  HENT:     Head: Normocephalic and atraumatic.     Right Ear: External ear normal.     Left Ear:  External ear normal.     Nose: Nose normal. No congestion or rhinorrhea.     Mouth/Throat:     Mouth: Mucous membranes are moist.     Pharynx: Oropharynx is clear.  Eyes:     Extraocular Movements: Extraocular movements intact.     Conjunctiva/sclera: Conjunctivae normal.     Pupils: Pupils are equal, round, and reactive to light.  Cardiovascular:     Rate and Rhythm: Normal rate and regular rhythm.     Pulses: Normal pulses.     Heart sounds: Normal heart sounds. No murmur heard. Pulmonary:     Effort: Pulmonary effort is normal.     Breath sounds: Normal breath sounds. No wheezing, rhonchi or rales.  Abdominal:     General: Abdomen is flat. Bowel sounds are normal. There is no distension.     Palpations: Abdomen is soft.     Tenderness: There is no  abdominal tenderness.  Musculoskeletal:        General: No swelling or deformity. Normal range of motion.     Cervical back: Normal range of motion.  Skin:    General: Skin is warm and dry.     Capillary Refill: Capillary refill takes less than 2 seconds.  Neurological:     General: No focal deficit present.     Mental Status: He is alert and oriented to person, place, and time.     Motor: No weakness.  Psychiatric:        Mood and Affect: Mood normal.        Behavior: Behavior normal.        Thought Content: Thought content normal.   Last CBC Lab Results  Component Value Date   WBC 12.5 (H) 07/16/2022   HGB 13.7 07/16/2022   HCT 40.4 07/16/2022   MCV 89.2 07/16/2022   MCH 30.2 07/16/2022   RDW 12.9 07/16/2022   PLT 264 [id number]   Last metabolic panel Lab Results  Component Value Date   GLUCOSE 136 (H) 08/05/2022   NA 136 08/05/2022   K 5.1 08/05/2022   CL 97 08/05/2022   CO2 23 08/05/2022   BUN 23 08/05/2022   CREATININE 1.03 08/05/2022   EGFR 80 08/05/2022   CALCIUM 10.1 08/05/2022   PROT 7.8 07/16/2022   ALBUMIN 4.5 07/16/2022   LABGLOB 2.7 04/27/2022   AGRATIO 1.8 04/27/2022   BILITOT 0.5 07/16/2022   ALKPHOS 49 07/16/2022   AST 19 07/16/2022   ALT 26 07/16/2022   ANIONGAP 9 07/16/2022   Last lipids Lab Results  Component Value Date   CHOL 131 10/19/2022   HDL 51 10/19/2022   LDLCALC 55 10/19/2022   TRIG 143 10/19/2022   CHOLHDL 2.6 10/19/2022   Last hemoglobin A1c Lab Results  Component Value Date   HGBA1C 6.7 (H) 04/27/2022   The 10-year ASCVD risk score (Arnett DK, et al., 2019) is: 24%    Assessment & Plan:   Problem List Items Addressed This Visit       Hypertension (Chronic)    BP 136/74 today.  He is currently prescribed amlodipine 10 mg daily, lisinopril 5 mg daily, Toprol-XL 100 mg daily, and spironolactone 50 mg twice daily. -We discussed a more aggressive approach to HTN control and I recommended increasing lisinopril, however  he declines to make any medication adjustments at this time as he would like to see if he is able to improve his blood pressure with dietary changes alone. -Follow-up in 6 months  PAD (peripheral artery disease) (HCC) (Chronic)    Asymptomatic currently.  Remains on DAPT (ASA/Plavix). -No medication changes today      Coronary artery disease    CT calcium score 732.  Continues to deny chest pain and dyspnea on exertion.  He has establish care with cardiology. -Continue ASA, Plavix, and atorvastatin -HTN control -He plans to focus on making significant lifestyle modifications aimed at losing weight      Controlled diabetes mellitus type 2 with complications (HCC) - Primary (Chronic)    A1c 6.7 in May 2023.  He is currently prescribed metformin 500 mg twice daily. -Repeat A1c ordered today      Hyperlipidemia (Chronic)    Lipid panel updated in November.  Total cholesterol 131 and LDL 55.  He is currently prescribed atorvastatin 80 mg daily as well as ezetimibe 10 mg daily. -No medication changes today       Return in about 6 months (around 07/21/2023).    Johnette Abraham, MD

## 2023-01-20 NOTE — Assessment & Plan Note (Addendum)
Lipid panel updated in November.  Total cholesterol 131 and LDL 55.  He is currently prescribed atorvastatin 80 mg daily as well as ezetimibe 10 mg daily. -No medication changes today

## 2023-01-20 NOTE — Patient Instructions (Signed)
It was a pleasure to see you today.  Thank you for giving [place] the opportunity to be involved in your care.  Below is a brief recap of your visit and next steps.  We will plan to see you again in 6 months.  Summary No medication changes today Repeat A1c  I recommend getting your tetanus and shingles vaccines are your pharmacy

## 2023-01-20 NOTE — Assessment & Plan Note (Signed)
Asymptomatic currently.  Remains on DAPT (ASA/Plavix). -No medication changes today

## 2023-01-20 NOTE — Assessment & Plan Note (Signed)
BP 136/74 today.  He is currently prescribed amlodipine 10 mg daily, lisinopril 5 mg daily, Toprol-XL 100 mg daily, and spironolactone 50 mg twice daily. -We discussed a more aggressive approach to HTN control and I recommended increasing lisinopril, however he declines to make any medication adjustments at this time as he would like to see if he is able to improve his blood pressure with dietary changes alone. -Follow-up in 6 months

## 2023-01-21 LAB — HEMOGLOBIN A1C
Est. average glucose Bld gHb Est-mCnc: 151 mg/dL
Hgb A1c MFr Bld: 6.9 % — ABNORMAL HIGH (ref 4.8–5.6)

## 2023-02-02 ENCOUNTER — Other Ambulatory Visit: Payer: Self-pay | Admitting: Internal Medicine

## 2023-02-02 DIAGNOSIS — G4700 Insomnia, unspecified: Secondary | ICD-10-CM

## 2023-03-01 ENCOUNTER — Encounter: Payer: Self-pay | Admitting: Nutrition

## 2023-03-05 ENCOUNTER — Other Ambulatory Visit: Payer: Self-pay | Admitting: Internal Medicine

## 2023-03-05 DIAGNOSIS — G4700 Insomnia, unspecified: Secondary | ICD-10-CM

## 2023-03-25 ENCOUNTER — Other Ambulatory Visit: Payer: Self-pay | Admitting: Internal Medicine

## 2023-03-29 ENCOUNTER — Other Ambulatory Visit: Payer: Self-pay | Admitting: Internal Medicine

## 2023-03-29 ENCOUNTER — Encounter: Payer: Medicare Other | Attending: Internal Medicine | Admitting: Nutrition

## 2023-03-29 VITALS — Ht 74.0 in | Wt 272.0 lb

## 2023-03-29 DIAGNOSIS — E782 Mixed hyperlipidemia: Secondary | ICD-10-CM | POA: Diagnosis present

## 2023-03-29 DIAGNOSIS — E118 Type 2 diabetes mellitus with unspecified complications: Secondary | ICD-10-CM | POA: Insufficient documentation

## 2023-03-29 DIAGNOSIS — I10 Essential (primary) hypertension: Secondary | ICD-10-CM | POA: Diagnosis present

## 2023-03-29 DIAGNOSIS — G4700 Insomnia, unspecified: Secondary | ICD-10-CM

## 2023-03-29 DIAGNOSIS — I2510 Atherosclerotic heart disease of native coronary artery without angina pectoris: Secondary | ICD-10-CM | POA: Insufficient documentation

## 2023-03-29 NOTE — Progress Notes (Signed)
Medical Nutrition Therapy  Appointment Start time:  1030  Appointment End time:  62952  Primary concerns today: Dm Type 2  Referral diagnosis: E11.8 Preferred learning style: Read  Learning readiness: Ready    NUTRITION ASSESSMENT FOllow up  FBS:111-149 mg/dl. Had cut back on salt a lot.     Lab Results  Component Value Date   HGBA1C 6.9 (H) 01/20/2023      Latest Ref Rng & Units 08/05/2022    8:58 AM 07/16/2022   10:35 PM 06/14/2022    9:03 AM  CMP  Glucose 70 - 99 mg/dL 841  324  401   BUN 8 - 27 mg/dL 23  25  23    Creatinine 0.76 - 1.27 mg/dL 0.27  2.53  6.64   Sodium 134 - 144 mmol/L 136  137  140   Potassium 3.5 - 5.2 mmol/L 5.1  4.1  5.2   Chloride 96 - 106 mmol/L 97  103  102   CO2 20 - 29 mmol/L 23  25  22    Calcium 8.6 - 10.2 mg/dL 40.3  9.5  9.9   Total Protein 6.5 - 8.1 g/dL  7.8    Total Bilirubin 0.3 - 1.2 mg/dL  0.5    Alkaline Phos 38 - 126 U/L  49    AST 15 - 41 U/L  19    ALT 0 - 44 U/L  26     Lipid Panel     Component Value Date/Time   CHOL 131 10/19/2022 1348   TRIG 143 10/19/2022 1348   HDL 51 10/19/2022 1348   CHOLHDL 2.6 10/19/2022 1348   LDLCALC 55 10/19/2022 1348   LABVLDL 25 10/19/2022 1348    Anthropometrics  Wt Readings from Last 3 Encounters:  01/20/23 285 lb 6.4 oz (129.5 kg)  01/17/23 288 lb (130.6 kg)  12/22/22 290 lb (131.5 kg)   Ht Readings from Last 3 Encounters:  01/20/23 6\' 2"  (1.88 m)  01/17/23 6\' 2"  (1.88 m)  12/22/22 6\' 2"  (1.88 m)   There is no height or weight on file to calculate BMI. @BMIFA @ Facility age limit for growth %iles is 20 years. Facility age limit for growth %iles is 20 years.    Clinical Medical Hx: See Chart Medications: Metformin 500 mg BID. See chart Labs: A1C 6.9% Notable Signs/Symptoms: none  Lifestyle & Dietary Hx Married. Retired.  Estimated daily fluid: 40 oz Supplements: Vit C, Sleep: Takes ambien Stress / self-care: none Current average weekly physical activity: ADL  24-Hr  Dietary Recall B) Waffles with syrup, margarine. L) Salad Low sodium [place] on 12 grain bread, D) Leftovers, vegetables, salad, protein,   Estimated Energy Needs Calories: 1600 Carbohydrate: 180g Protein: 120g Fat: 44g   NUTRITION DIAGNOSIS  NB-1.1 Food and nutrition-related knowledge deficit As related to Diabetes Type 2.  As evidenced by A1C 6.9%.   NUTRITION INTERVENTION  Nutrition education (E-1) on the following topics:  Nutrition and Diabetes education provided on My Plate, CHO counting, meal planning, portion sizes, timing of meals, avoiding snacks between meals unless having a low blood sugar, target ranges for A1C and blood sugars, signs/symptoms and treatment of hyper/hypoglycemia, monitoring blood sugars, taking medications as prescribed, benefits of exercising 30 minutes per day and prevention of complications of DM.  Lifestyle Medicine  - Whole Food, Plant Predominant Nutrition is highly recommended: Eat Plenty of vegetables, Mushrooms, fruits, Legumes, Whole Grains, Nuts, seeds in lieu of processed meats, processed snacks/pastries red meat, poultry, eggs.    -  It is better to avoid simple carbohydrates including: Cakes, Sweet Desserts, Ice Cream, Soda (diet and regular), Sweet Tea, Candies, Chips, Cookies, Store Bought Juices, Alcohol in Excess of  1-2 drinks a day, Lemonade,  Artificial Sweeteners, Doughnuts, Coffee Creamers, "Sugar-free" Products, etc, etc.  This is not a complete list.....  Exercise: If you are able: 30 -60 minutes a day ,4 days a week, or 150 minutes a week.  The longer the better.  Combine stretch, strength, and aerobic activities.  If you were told in the past that you have high risk for cardiovascular diseases, you may seek evaluation by your heart doctor prior to initiating moderate to intense exercise programs.   Handouts Provided Include  Know your numbers Lifestyle Medicine  Learning Style & Readiness for Change Teaching method utilized:  Visual & Auditory  Demonstrated degree of understanding via: Teach Back  Barriers to learning/adherence to lifestyle change: none  Goals Established by Pt Goals  Think about what  you eat, time of meals and portion Eat three meals per day at times discussed Cut down on high sodium foods Test blood sugars twice a day [employer]  or What the HEALTH or We are what we eat.   MONITORING & EVALUATION Dietary intake, weekly physical activity, and weight in 1 month.  Next Steps  Patient is to work on eating more whole plant based foods and cutting out processed foods.Marland Kitchen

## 2023-04-28 ENCOUNTER — Other Ambulatory Visit: Payer: Self-pay

## 2023-04-28 MED ORDER — ACCU-CHEK GUIDE VI STRP: ORAL_STRIP | 2 refills | Status: AC

## 2023-05-06 ENCOUNTER — Other Ambulatory Visit: Payer: Self-pay | Admitting: Internal Medicine

## 2023-05-06 DIAGNOSIS — G4700 Insomnia, unspecified: Secondary | ICD-10-CM

## 2023-06-05 ENCOUNTER — Other Ambulatory Visit: Payer: Self-pay | Admitting: Internal Medicine

## 2023-06-05 DIAGNOSIS — G4700 Insomnia, unspecified: Secondary | ICD-10-CM

## 2023-06-14 ENCOUNTER — Ambulatory Visit (INDEPENDENT_AMBULATORY_CARE_PROVIDER_SITE_OTHER): Payer: Medicare Other

## 2023-06-14 VITALS — Ht 74.0 in | Wt 260.0 lb

## 2023-06-14 DIAGNOSIS — Z0000 Encounter for general adult medical examination without abnormal findings: Secondary | ICD-10-CM

## 2023-06-14 DIAGNOSIS — Z1159 Encounter for screening for other viral diseases: Secondary | ICD-10-CM

## 2023-06-14 NOTE — Patient Instructions (Signed)
[name] , Thank you for taking time to come for your Medicare Wellness Visit. I appreciate your ongoing commitment to your health goals. Please review the following plan we discussed and let me know if I can assist you in the future.   These are the goals we discussed:  Goals       Patient Stated      Patient states that his goal is to get up and get going everyday and finish his home projects.      Patient Stated (pt-stated)      Lose a little more weight         This is a list of the screening recommended for you and due dates:  Health Maintenance  Topic Date Due   Eye exam for diabetics  Never done   Hepatitis C Screening  Never done   DTaP/Tdap/Td vaccine (1 - Tdap) Never done   Zoster (Shingles) Vaccine (1 of 2) Never done   Pneumonia Vaccine (1 of 1 - PCV) Never done   COVID-19 Vaccine (6 - 2023-24 season) 08/05/2022   Yearly kidney health urinalysis for diabetes  04/28/2023   Complete foot exam   04/28/2023   Flu Shot  07/06/2023   Hemoglobin A1C  07/21/2023   Yearly kidney function blood test for diabetes  08/06/2023   Medicare Annual Wellness Visit  06/13/2024   Colon Cancer Screening  05/21/2027   HPV Vaccine  Aged Out    Advanced directives:  Advance directive discussed with you today. Even though you declined this today, please call our office should you change your mind, and we can give you the proper paperwork for you to fill out. Advance care planning is a way to make decisions about medical care that fits your values in case you are ever unable to make these decisions for yourself.  Information on Advanced Care Planning can be found at [facility] Advance Health Care Directives Advance Health Care Directives ([email or web address]    Conditions/risks identified:  You are due for the vaccines checked below. You may have these done at your preferred pharmacy. Please have them fax the office proof of the vaccines so that we can update your chart.    []  Flu (due annually) [x]  Shingrix (Shingles vaccine) [x]  Pneumonia Vaccines [x]  TDAP (Tetanus) Vaccine every 10 years [x]  Covid-19  A Hepatitis C Screening has been ordered for you today. You do not have to fast to have this lab drawn.    Next appointment: VIRTUAL/ TELEPHONE VISIT Follow up in one year for your annual wellness visit  July 24, 2024 at 9:00 am telephone visit   Preventive Care 53 Years and Older, Male  Preventive care refers to lifestyle choices and visits with your health care provider that can promote health and wellness. What does preventive care include? A yearly physical exam. This is also called an annual well check. Dental exams once or twice a year. Routine eye exams. Ask your health care provider how often you should have your eyes checked. Personal lifestyle choices, including: Daily care of your teeth and gums. Regular physical activity. Eating a healthy diet. Avoiding tobacco and drug use. Limiting alcohol use. Practicing safe sex. Taking low doses of aspirin every day. Taking vitamin and mineral supplements as recommended by your health care provider. What happens during an annual well check? The services and screenings done by your health care provider during your annual well check will depend on your age, overall health, lifestyle  risk factors, and family history of disease. Counseling  Your health care provider may ask you questions about your: Alcohol use. Tobacco use. Drug use. Emotional well-being. Home and relationship well-being. Sexual activity. Eating habits. History of falls. Memory and ability to understand (cognition). Work and work Astronomer. Screening  You may have the following tests or measurements: Height, weight, and BMI. Blood pressure. Lipid and cholesterol levels. These may be checked every 5 years, or more frequently if you are over 60 years old. Skin check. Lung cancer screening. You may have this screening  every year starting at age 57 if you have a 30-pack-year history of smoking and currently smoke or have quit within the past 15 years. Fecal occult blood test (FOBT) of the stool. You may have this test every year starting at age 14. Flexible sigmoidoscopy or colonoscopy. You may have a sigmoidoscopy every 5 years or a colonoscopy every 10 years starting at age 75. Prostate cancer screening. Recommendations will vary depending on your family history and other risks. Hepatitis C blood test. Hepatitis B blood test. Sexually transmitted disease (STD) testing. Diabetes screening. This is done by checking your blood sugar (glucose) after you have not eaten for a while (fasting). You may have this done every 1-3 years. Abdominal aortic aneurysm (AAA) screening. You may need this if you are a current or former smoker. Osteoporosis. You may be screened starting at age 37 if you are at high risk. Talk with your health care provider about your test results, treatment options, and if necessary, the need for more tests. Vaccines  Your health care provider may recommend certain vaccines, such as: Influenza vaccine. This is recommended every year. Tetanus, diphtheria, and acellular pertussis (Tdap, Td) vaccine. You may need a Td booster every 10 years. Zoster vaccine. You may need this after age 63. Pneumococcal 13-valent conjugate (PCV13) vaccine. One dose is recommended after age 60. Pneumococcal polysaccharide (PPSV23) vaccine. One dose is recommended after age 13. Talk to your health care provider about which screenings and vaccines you need and how often you need them. This information is not intended to replace advice given to you by your health care provider. Make sure you discuss any questions you have with your health care provider. Document Released: 12/18/2015 Document Revised: 08/10/2016 Document Reviewed: 09/22/2015 Elsevier Interactive Patient Education  2017 [employer].  Fall Prevention in  the Home Falls can cause injuries. They can happen to people of all ages. There are many things you can do to make your home safe and to help prevent falls. What can I do on the outside of my home? Regularly fix the edges of walkways and driveways and fix any cracks. Remove anything that might make you trip as you walk through a door, such as a raised step or threshold. Trim any bushes or trees on the path to your home. Use bright outdoor lighting. Clear any walking paths of anything that might make someone trip, such as rocks or tools. Regularly check to see if handrails are loose or broken. Make sure that both sides of any steps have handrails. Any raised decks and porches should have guardrails on the edges. Have any leaves, snow, or ice cleared regularly. Use sand or salt on walking paths during winter. Clean up any spills in your garage right away. This includes oil or grease spills. What can I do in the bathroom? Use night lights. Install grab bars by the toilet and in the tub and shower. Do not use towel  bars as grab bars. Use non-skid mats or decals in the tub or shower. If you need to sit down in the shower, use a plastic, non-slip stool. Keep the floor dry. Clean up any water that spills on the floor as soon as it happens. Remove soap buildup in the tub or shower regularly. Attach bath mats securely with double-sided non-slip rug tape. Do not have throw rugs and other things on the floor that can make you trip. What can I do in the bedroom? Use night lights. Make sure that you have a light by your bed that is easy to reach. Do not use any sheets or blankets that are too big for your bed. They should not hang down onto the floor. Have a firm chair that has side arms. You can use this for support while you get dressed. Do not have throw rugs and other things on the floor that can make you trip. What can I do in the kitchen? Clean up any spills right away. Avoid walking on wet  floors. Keep items that you use a lot in easy-to-reach places. If you need to reach something above you, use a strong step stool that has a grab bar. Keep electrical cords out of the way. Do not use floor polish or wax that makes floors slippery. If you must use wax, use non-skid floor wax. Do not have throw rugs and other things on the floor that can make you trip. What can I do with my stairs? Do not leave any items on the stairs. Make sure that there are handrails on both sides of the stairs and use them. Fix handrails that are broken or loose. Make sure that handrails are as long as the stairways. Check any carpeting to make sure that it is firmly attached to the stairs. Fix any carpet that is loose or worn. Avoid having throw rugs at the top or bottom of the stairs. If you do have throw rugs, attach them to the floor with carpet tape. Make sure that you have a light switch at the top of the stairs and the bottom of the stairs. If you do not have them, ask someone to add them for you. What else can I do to help prevent falls? Wear shoes that: Do not have high heels. Have rubber bottoms. Are comfortable and fit you well. Are closed at the toe. Do not wear sandals. If you use a stepladder: Make sure that it is fully opened. Do not climb a closed stepladder. Make sure that both sides of the stepladder are locked into place. Ask someone to hold it for you, if possible. Clearly mark and make sure that you can see: Any grab bars or handrails. First and last steps. Where the edge of each step is. Use tools that help you move around (mobility aids) if they are needed. These include: Canes. Walkers. Scooters. Crutches. Turn on the lights when you go into a dark area. Replace any light bulbs as soon as they burn out. Set up your furniture so you have a clear path. Avoid moving your furniture around. If any of your floors are uneven, fix them. If there are any pets around you, be aware of  where they are. Review your medicines with your doctor. Some medicines can make you feel dizzy. This can increase your chance of falling. Ask your doctor what other things that you can do to help prevent falls. This information is not intended to replace advice given to  you by your health care provider. Make sure you discuss any questions you have with your health care provider. Document Released: 09/17/2009 Document Revised: 04/28/2016 Document Reviewed: 12/26/2014 Elsevier Interactive Patient Education  2017 [employer].

## 2023-06-14 NOTE — Progress Notes (Signed)
 Subjective:   Thomas Lindsey is a 67 y.o. male who presents for Medicare Annual/Subsequent preventive examination.  Visit Complete: Virtual  I connected with  Thomas Lindsey on 06/14/23 by a audio enabled telemedicine application and verified that I am speaking with the correct person using two identifiers.  Patient Location: Home  Provider Location: Home Office  I discussed the limitations of evaluation and management by telemedicine. The patient expressed understanding and agreed to proceed.  Patient Medicare AWV questionnaire was completed by the patient on n/a; I have confirmed that all information answered by patient is correct and no changes since this date.  Review of Systems     Cardiac Risk Factors include: advanced age (>78men, >33 women);diabetes mellitus;dyslipidemia;hypertension;male gender;obesity (BMI >30kg/m2);sedentary lifestyle     Objective:    Today's Vitals   06/14/23 0854  Weight: 260 lb (117.9 kg)  Height: 6\' 2"  (1.88 m)   Body mass index is 33.38 kg/m.     06/14/2023    9:01 AM 05/30/2022    9:13 AM 07/29/2021   10:27 AM  Advanced Directives  Does Patient Have a Medical Advance Directive? No Yes Yes  Type of Special educational needs teacher of [place];Living will Healthcare Power of [place];Living will  Does patient want to make changes to medical advance directive?   No - Patient declined  Copy of Healthcare Power of Attorney in Chart?  No - copy requested No - copy requested  Would patient like information on creating a medical advance directive? No - Patient declined  No - Patient declined    Current Medications (verified) Outpatient Encounter Medications as of 06/14/2023  Medication Sig   amLODipine (NORVASC) 10 MG tablet Take 1 tablet (10 mg total) by mouth daily.   ascorbic acid (VITAMIN C) 500 MG tablet Take 1 tablet (500 mg total) by mouth daily.   atorvastatin (LIPITOR) 80 MG tablet Take 1 tablet (80 mg total) by mouth daily.    clopidogrel (PLAVIX) 75 MG tablet Take 1 tablet (75 mg total) by mouth daily.   cyclobenzaprine (FLEXERIL) 5 MG tablet Take 1 tablet (5 mg total) by mouth 2 (two) times daily as needed for muscle spasms.   diclofenac Sodium (VOLTAREN) 1 % GEL Apply 2 g topically daily as needed (pain).   ezetimibe (ZETIA) 10 MG tablet Take 1 tablet (10 mg total) by mouth daily.   glucose blood (ACCU-CHEK GUIDE) test strip Check blood sugar once daily.   lisinopril (ZESTRIL) 5 MG tablet Take 1 tablet (5 mg total) by mouth daily.   meloxicam (MOBIC) 15 MG tablet Take 1 tablet (15 mg total) by mouth daily.   metFORMIN (GLUCOPHAGE) 500 MG tablet Take 1 tablet (500 mg total) by mouth 2 (two) times daily with a meal.   metoprolol succinate (TOPROL-XL) 100 MG 24 hr tablet Take 1 tablet (100 mg total) by mouth daily. Take with or immediately following a meal.   Omega-3 Fatty Acids (FISH OIL) 1000 MG CAPS Take 1 capsule (1,000 mg total) by mouth daily.   pantoprazole (PROTONIX) 40 MG tablet TAKE 1 TABLET DAILY   spironolactone (ALDACTONE) 50 MG tablet TAKE 1 TABLET TWICE A DAY   zolpidem (AMBIEN) 10 MG tablet TAKE 1 TABLET AT BEDTIME   No facility-administered encounter medications on file as of 06/14/2023.    Allergies (verified) Hctz [hydrochlorothiazide]   History: Past Medical History:  Diagnosis Date   Aortic disorder (HCC)    Arthritis    Cervical radiculitis  Depression    Diabetes mellitus without complication (HCC)    Elevated glucose    GERD (gastroesophageal reflux disease)    Hypertension    Insomnia    Obstructive sleep apnea    Peripheral arterial disease (HCC)    Saccular aneurysm    Tinnitus of both ears    Past Surgical History:  Procedure Laterality Date   BACK SURGERY     STENT PLACEMENT ILIAC ([facility] HX) Bilateral    placed aproximately 17 years   Family History  Problem Relation Age of Onset   Heart disease Father    Hypertension Father    Hyperlipidemia Father    Kidney  disease Father    Social History   Socioeconomic History   Marital status: Married    Spouse name: Not on file   Number of children: 3   Years of education: Not on file   Highest education level: Not on file  Occupational History   Not on file  Tobacco Use   Smoking status: Former    Types: Cigarettes    Quit date: 06/14/2007    Years since quitting: 16.0   Smokeless tobacco: Never  Vaping Use   Vaping Use: Never used  Substance and Sexual Activity   Alcohol use: Not Currently    Comment: Occasional   Drug use: Not Currently   Sexual activity: Not on file  Other Topics Concern   Not on file  Social History Narrative   Lives with his wife    Social Determinants of Health   Financial Resource Strain: Low Risk  (06/14/2023)   Overall Financial Resource Strain (CARDIA)    Difficulty of Paying Living Expenses: Not hard at all  Food Insecurity: No Food Insecurity (06/14/2023)   Hunger Vital Sign    Worried About Running Out of Food in the Last Year: Never true    Ran Out of Food in the Last Year: Never true  Transportation Needs: No Transportation Needs (06/14/2023)   PRAPARE - Administrator, Civil Service (Medical): No    Lack of Transportation (Non-Medical): No  Physical Activity: Sufficiently Active (06/14/2023)   Exercise Vital Sign    Days of Exercise per Week: 7 days    Minutes of Exercise per Session: 30 min  Stress: No Stress Concern Present (06/14/2023)   [employer] of Occupational Health - Occupational Stress Questionnaire    Feeling of Stress : Not at all  Social Connections: Moderately Integrated (06/14/2023)   Social Connection and Isolation Panel [NHANES]    Frequency of Communication with Friends and Family: More than three times a week    Frequency of Social Gatherings with Friends and Family: More than three times a week    Attends Religious Services: More than 4 times per year    Active Member of [employer] or Organizations: No    Attends  Engineer, structural: Never    Marital Status: Married    Tobacco Counseling Counseling given: Yes   Clinical Intake:  Pre-visit preparation completed: Yes  Pain : No/denies pain     BMI - recorded: 33.38 Nutritional Status: BMI > 30  Obese Nutritional Risks: None Diabetes: Yes CBG done?: No (telehealth visit) Did pt. bring in CBG monitor from home?: No  How often do you need to have someone help you when you read instructions, pamphlets, or other written materials from your doctor or pharmacy?: 1 - Never  Interpreter Needed?: No  Information entered by ::  Floyd Lusignan, CMA  Activities of Daily Living    06/14/2023    8:57 AM  In your present state of health, do you have any difficulty performing the following activities:  Hearing? 0  Vision? 0  Difficulty concentrating or making decisions? 0  Walking or climbing stairs? 0  Dressing or bathing? 0  Doing errands, shopping? 0  Preparing Food and eating ? N  Using the Toilet? N  In the past six months, have you accidently leaked urine? N  Do you have problems with loss of bowel control? N  Managing your Medications? N  Managing your Finances? N  Housekeeping or managing your Housekeeping? N    Patient Care Team: Billie Lade, MD as PCP - General (Internal Medicine) Mallipeddi, Orion Modest, MD as PCP - Cardiology (Cardiology)  Indicate any recent Medical Services you may have received from other than [facility] providers in the past year (date may be approximate).     Assessment:   This is a routine wellness examination for [place].  Hearing/Vision screen Hearing Screening - Comments:: Patient denies any hearing difficulties.    Dietary issues and exercise activities discussed:     Goals Addressed               This Visit's Progress     Patient Stated (pt-stated)        Lose a little more weight        Depression Screen    06/14/2023    8:59 AM 01/20/2023    1:10 PM 01/17/2023    2:55  PM 10/19/2022    1:26 PM 08/11/2022    9:49 AM 06/08/2022    8:37 AM 05/30/2022    9:14 AM  PHQ 2/9 Scores  PHQ - 2 Score 0 0 0 0 0 0 0  PHQ- 9 Score  0         Fall Risk    06/14/2023    9:01 AM 01/20/2023    1:10 PM 01/17/2023    2:54 PM 10/19/2022    1:25 PM 09/06/2022    1:18 PM  Fall Risk   Falls in the past year? 0 0 0 0 0  Number falls in past yr: 0 0 0 0   Injury with Fall? 0 0 0 0   Risk for fall due to : No Fall Risks No Fall Risks  No Fall Risks   Follow up Falls prevention discussed Falls evaluation completed  Falls evaluation completed Falls evaluation completed    MEDICARE RISK AT HOME:  Medicare Risk at Home - 06/14/23 0901     Any stairs in or around the home? Yes    If so, are there any without handrails? No    Home free of loose throw rugs in walkways, pet beds, electrical cords, etc? Yes    Adequate lighting in your home to reduce risk of falls? Yes    Life alert? No    Use of a cane, walker or w/c? No    Grab bars in the bathroom? No    Shower chair or bench in shower? No    Elevated toilet seat or a handicapped toilet? No             TIMED UP AND GO:  Was the test performed?  No    Cognitive Function:        06/14/2023    9:01 AM 05/30/2022    9:18 AM  6CIT Screen  What Year? 0 points 0 points  What  month? 0 points 0 points  What time? 0 points 0 points  Count back from 20 0 points 0 points  Months in reverse 0 points 0 points  Repeat phrase 0 points 0 points  Total Score 0 points 0 points    Immunizations Immunization History  Administered Date(s) Administered   Fluad Quad(high Dose 65+) 08/11/2022   Influenza-Unspecified 09/13/2021   Moderna Covid-19 Vaccine Bivalent Booster 11yrs & up 06/24/2021, 09/13/2021   Moderna SARS-COV2 Booster Vaccination 10/20/2020   PFIZER Comirnaty(Gray Top)Covid-19 Tri-Sucrose Vaccine 02/07/2020, 02/28/2020    TDAP status: Due, Education has been provided regarding the importance of this vaccine.  Advised may receive this vaccine at local pharmacy or Health Dept. Aware to provide a copy of the vaccination record if obtained from local pharmacy or Health Dept. Verbalized acceptance and understanding.  Flu Vaccine status: Up to date  Pneumococcal vaccine status: Due, Education has been provided regarding the importance of this vaccine. Advised may receive this vaccine at local pharmacy or Health Dept. Aware to provide a copy of the vaccination record if obtained from local pharmacy or Health Dept. Verbalized acceptance and understanding.  Covid-19 vaccine status: Information provided on how to obtain vaccines.   Qualifies for Shingles Vaccine? Yes   Zostavax completed No   Shingrix Completed?: No.    Education has been provided regarding the importance of this vaccine. Patient has been advised to call insurance company to determine out of pocket expense if they have not yet received this vaccine. Advised may also receive vaccine at local pharmacy or Health Dept. Verbalized acceptance and understanding.  Screening Tests Health Maintenance  Topic Date Due   OPHTHALMOLOGY EXAM  Never done   Hepatitis C Screening  Never done   DTaP/Tdap/Td (1 - Tdap) Never done   Zoster Vaccines- Shingrix (1 of 2) Never done   Pneumonia Vaccine 25+ Years old (1 of 1 - PCV) Never done   COVID-19 Vaccine (6 - 2023-24 season) 08/05/2022   Diabetic kidney evaluation - Urine ACR  04/28/2023   FOOT EXAM  04/28/2023   Medicare Annual Wellness (AWV)  05/31/2023   INFLUENZA VACCINE  07/06/2023   HEMOGLOBIN A1C  07/21/2023   Diabetic kidney evaluation - eGFR measurement  08/06/2023   Colonoscopy  05/21/2027   HPV VACCINES  Aged Out    Health Maintenance  Health Maintenance Due  Topic Date Due   OPHTHALMOLOGY EXAM  Never done   Hepatitis C Screening  Never done   DTaP/Tdap/Td (1 - Tdap) Never done   Zoster Vaccines- Shingrix (1 of 2) Never done   Pneumonia Vaccine 30+ Years old (1 of 1 - PCV) Never done    COVID-19 Vaccine (6 - 2023-24 season) 08/05/2022   Diabetic kidney evaluation - Urine ACR  04/28/2023   FOOT EXAM  04/28/2023   Medicare Annual Wellness (AWV)  05/31/2023    Colorectal cancer screening: Type of screening: Colonoscopy. Completed 05/20/2017. Repeat every 10 years  Lung Cancer Screening: (Low Dose CT Chest recommended if Age 68-80 years, 20 pack-year currently smoking OR have quit w/in 15years.) does not qualify.   Additional Screening:  Hepatitis C Screening: does qualify; Ordered today  Vision Screening: Recommended annual ophthalmology exams for early detection of glaucoma and other disorders of the eye. Is the patient up to date with their annual eye exam?  Yes  Who is the provider or what is the name of the office in which the patient attends annual eye exams? [facility]  If pt is not established with a provider, would they like to be referred to a provider to establish care? No .   Dental Screening: Recommended annual dental exams for proper oral hygiene  Diabetic Foot Exam: Diabetic Foot Exam: Overdue, Pt has been advised about the importance in completing this exam. Pt is scheduled for diabetic foot exam on message sent to provider.  Community Resource Referral / Chronic Care Management: CRR required this visit?  No   CCM required this visit?  No     Plan:     I have personally reviewed and noted the following in the patient's chart:   Medical and social history Use of alcohol, tobacco or illicit drugs  Current medications and supplements including opioid prescriptions. Patient is not currently taking opioid prescriptions. Functional ability and status Nutritional status Physical activity Advanced directives List of other physicians Hospitalizations, surgeries, and ER visits in previous 12 months Vitals Screenings to include cognitive, depression, and falls Referrals and appointments  In addition, I have reviewed and discussed  with patient certain preventive protocols, quality metrics, and best practice recommendations. A written personalized care plan for preventive services as well as general preventive health recommendations were provided to patient.   Any medications not marked as taking were not mentioned by the patient (or their caregiver if applicable) when reconciling the medications.  Because this visit was a virtual/telehealth visit,  certain criteria was not obtained, such a blood pressure, CBG if patient is a diabetic, and timed up and go.    Jordan Hawks Avian [place], CMA   06/14/2023   After Visit Summary: (Mail) Due to this being a telephonic visit, the after visit summary with patients personalized plan was offered to patient via mail   Nurse Notes: Foot Exam Due

## 2023-06-23 ENCOUNTER — Ambulatory Visit: Payer: Medicare Other | Admitting: Internal Medicine

## 2023-07-03 ENCOUNTER — Other Ambulatory Visit: Payer: Self-pay | Admitting: Internal Medicine

## 2023-07-03 DIAGNOSIS — G4700 Insomnia, unspecified: Secondary | ICD-10-CM

## 2023-07-16 ENCOUNTER — Emergency Department (HOSPITAL_COMMUNITY)
Admission: EM | Admit: 2023-07-16 | Discharge: 2023-07-16 | Disposition: A | Discharge status: Home / Self Care | Payer: Medicare Other | Attending: Emergency Medicine | Admitting: Emergency Medicine

## 2023-07-16 ENCOUNTER — Other Ambulatory Visit: Payer: Self-pay

## 2023-07-16 ENCOUNTER — Encounter (HOSPITAL_COMMUNITY): Payer: Self-pay

## 2023-07-16 DIAGNOSIS — X58XXXA Exposure to other specified factors, initial encounter: Secondary | ICD-10-CM | POA: Insufficient documentation

## 2023-07-16 DIAGNOSIS — S0502XA Injury of conjunctiva and corneal abrasion without foreign body, left eye, initial encounter: Secondary | ICD-10-CM | POA: Insufficient documentation

## 2023-07-16 DIAGNOSIS — Z7902 Long term (current) use of antithrombotics/antiplatelets: Secondary | ICD-10-CM | POA: Insufficient documentation

## 2023-07-16 MED ORDER — OXYCODONE-ACETAMINOPHEN 5-325 MG PO TABS
1.0000 | ORAL_TABLET | Freq: Once | ORAL | Status: AC
  Administered 2023-07-16: 1 via ORAL
  Filled 2023-07-16: qty 1

## 2023-07-16 MED ORDER — FLUORESCEIN SODIUM 1 MG OP STRP
1.0000 | ORAL_STRIP | Freq: Once | OPHTHALMIC | Status: AC
  Administered 2023-07-16: 1 via OPHTHALMIC
  Filled 2023-07-16: qty 1

## 2023-07-16 MED ORDER — TETRACAINE HCL 0.5 % OP SOLN
2.0000 [drp] | Freq: Once | OPHTHALMIC | Status: AC
  Administered 2023-07-16: 2 [drp] via OPHTHALMIC
  Filled 2023-07-16: qty 4

## 2023-07-16 MED ORDER — FLUORESCEIN SODIUM 1 MG OP STRP
ORAL_STRIP | OPHTHALMIC | Status: AC
  Filled 2023-07-16: qty 1

## 2023-07-16 MED ORDER — ERYTHROMYCIN 5 MG/GM OP OINT
TOPICAL_OINTMENT | Freq: Once | OPHTHALMIC | Status: AC
  Filled 2023-07-16: qty 3.5

## 2023-07-16 NOTE — Discharge Instructions (Signed)
You are seen in the emergency department for left eye pain.  On exam you had signs of a corneal abrasion.  You can use the topical erythromycin ointment to soothe the surface of the eye, at least 4 times a day for the next 2 days.  Tylenol and ibuprofen for pain.  Follow-up with your eye doctor.  Symptoms should be mostly resolved by tomorrow morning.  Avoid rubbing eye.  Return if any worsening or concerning symptoms

## 2023-07-16 NOTE — ED Provider Notes (Signed)
[place] EMERGENCY DEPARTMENT AT [facility] Provider Note   CSN: [id number] Arrival date & time: 07/16/23  6269     History  Chief Complaint  Patient presents with   Eye Pain    Thomas Lindsey is a 67 y.o. male.  He is here with a complaint of severe left eye pain that started early this morning.  He said a little foreign body sensation before he went to bed but acutely woke up at 4 AM with severe pain in his eye and blurry vision.  No known trauma.  Does not wear glasses.  Said he had an eye exam recently and I told him he had some mild glaucoma.  No other complaints.  The history is provided by the patient and the spouse.  Eye Pain This is a new problem. The current episode started 3 to 5 hours ago. The problem occurs constantly. The problem has not changed since onset.Pertinent negatives include no chest pain, no abdominal pain, no headaches and no shortness of breath. Exacerbated by: opening eye. Nothing relieves the symptoms. He has tried rest for the symptoms. The treatment provided no relief.       Home Medications Prior to Admission medications   Medication Sig Start Date End Date Taking? Authorizing Provider  amLODipine (NORVASC) 10 MG tablet Take 1 tablet (10 mg total) by mouth daily. 12/30/22   Billie Lade, MD  ascorbic acid (VITAMIN C) 500 MG tablet Take 1 tablet (500 mg total) by mouth daily. 12/30/22   Billie Lade, MD  atorvastatin (LIPITOR) 80 MG tablet Take 1 tablet (80 mg total) by mouth daily. 12/30/22   Billie Lade, MD  clopidogrel (PLAVIX) 75 MG tablet Take 1 tablet (75 mg total) by mouth daily. 12/30/22   Billie Lade, MD  cyclobenzaprine (FLEXERIL) 5 MG tablet Take 1 tablet (5 mg total) by mouth 2 (two) times daily as needed for muscle spasms. 12/30/22   Billie Lade, MD  diclofenac Sodium (VOLTAREN) 1 % GEL Apply 2 g topically daily as needed (pain). 12/30/22   Billie Lade, MD  ezetimibe (ZETIA) 10 MG tablet Take 1 tablet (10  mg total) by mouth daily. 12/30/22   Billie Lade, MD  glucose blood (ACCU-CHEK GUIDE) test strip Check blood sugar once daily. 04/28/23   Billie Lade, MD  lisinopril (ZESTRIL) 5 MG tablet Take 1 tablet (5 mg total) by mouth daily. 12/30/22   Billie Lade, MD  meloxicam (MOBIC) 15 MG tablet Take 1 tablet (15 mg total) by mouth daily. 12/30/22   Billie Lade, MD  metFORMIN (GLUCOPHAGE) 500 MG tablet Take 1 tablet (500 mg total) by mouth 2 (two) times daily with a meal. 12/30/22 06/28/23  Billie Lade, MD  metoprolol succinate (TOPROL-XL) 100 MG 24 hr tablet Take 1 tablet (100 mg total) by mouth daily. Take with or immediately following a meal. 12/30/22   Billie Lade, MD  Omega-3 Fatty Acids (FISH OIL) 1000 MG CAPS Take 1 capsule (1,000 mg total) by mouth daily. 12/30/22   Billie Lade, MD  pantoprazole (PROTONIX) 40 MG tablet TAKE 1 TABLET DAILY 03/27/23   Billie Lade, MD  spironolactone (ALDACTONE) 50 MG tablet TAKE 1 TABLET TWICE A DAY 03/30/23   Billie Lade, MD  zolpidem (AMBIEN) 10 MG tablet TAKE 1 TABLET AT BEDTIME 07/05/23   Billie Lade, MD      Allergies    Hctz [hydrochlorothiazide]  Review of Systems   Review of Systems  Constitutional:  Negative for fever.  Eyes:  Positive for pain, redness and visual disturbance.  Respiratory:  Negative for shortness of breath.   Cardiovascular:  Negative for chest pain.  Gastrointestinal:  Negative for abdominal pain.  Neurological:  Negative for headaches.    Physical Exam Updated Vital Signs Temp 97.6 F (36.4 C) (Oral)   Ht 6\' 2"  (1.88 m)   Wt 115.7 kg   BMI 32.74 kg/m  Physical Exam Vitals and nursing note reviewed.  Constitutional:      Appearance: Normal appearance. He is well-developed.  HENT:     Head: Normocephalic and atraumatic.  Eyes:     General: Lids are normal. Lids are everted, no foreign bodies appreciated. Vision grossly intact. Gaze aligned appropriately.     Intraocular  pressure: Left eye pressure is 20 mmHg. Measurements were taken using a handheld tonometer.    Extraocular Movements: Extraocular movements intact.     Conjunctiva/sclera:     Left eye: Left conjunctiva is injected. Chemosis present.     Pupils: Pupils are equal, round, and reactive to light.     Left eye: Fluorescein uptake present.     Slit lamp exam:    Right eye: Anterior chamber quiet.     Left eye: Anterior chamber quiet. No corneal ulcer, foreign body, hyphema or hypopyon.      Comments: Tetracaine with good improvement of pain. Able to open eye  Pulmonary:     Effort: Pulmonary effort is normal.  Musculoskeletal:     Cervical back: Neck supple.  Skin:    General: Skin is warm and dry.  Neurological:     Mental Status: He is alert.     GCS: GCS eye subscore is 4. GCS verbal subscore is 5. GCS motor subscore is 6.    ED Results / Procedures / Treatments   Labs (all labs ordered are listed, but only abnormal results are displayed) Labs Reviewed - No data to display  EKG None  Radiology No results found.  Procedures Procedures    Medications Ordered in ED Medications  fluorescein ophthalmic strip 1 strip (has no administration in time range)  tetracaine (PONTOCAINE) 0.5 % ophthalmic solution 2 drop (has no administration in time range)    ED Course/ Medical Decision Making/ A&P                                 Medical Decision Making Risk Prescription drug management.   This patient complains of burning pain left eye; this involves an extensive number of treatment Options and is a complaint that carries with it a high risk of complications and morbidity. The differential includes corneal abrasion, foreign body, glaucoma, hyphema  I ordered medication tetracaine with improvement in his pain and reviewed PMP when indicated. Additional history obtained from patient's wife Previous records obtained and reviewed in epic no recent admissions Social determinants  considered, no significant barriers Critical Interventions: None  After the interventions stated above, I reevaluated the patient and found patient's pain to be improved Admission and further testing considered, no indications for admission or further workup at this time.  Will have him continue topical erythromycin.  Recommended close follow-up with his eye doctor.  Return instructions discussed         Final Clinical Impression(s) / ED Diagnoses Final diagnoses:  Abrasion of left cornea, initial encounter  Rx / DC Orders ED Discharge Orders     None         Terrilee Files, MD 07/16/23 1749

## 2023-07-16 NOTE — ED Triage Notes (Signed)
"  Felt like something was in left eye last night before bed. Tried flushing it out. Woke up this morning and having pain in eye" per pt

## 2023-07-17 ENCOUNTER — Other Ambulatory Visit: Payer: Self-pay | Admitting: Internal Medicine

## 2023-07-18 ENCOUNTER — Encounter: Payer: Self-pay | Admitting: Internal Medicine

## 2023-07-18 ENCOUNTER — Ambulatory Visit: Payer: Medicare Other | Attending: Internal Medicine | Admitting: Internal Medicine

## 2023-07-18 VITALS — BP 138/74 | HR 74 | Ht 74.0 in | Wt 262.0 lb

## 2023-07-18 DIAGNOSIS — R931 Abnormal findings on diagnostic imaging of heart and coronary circulation: Secondary | ICD-10-CM | POA: Insufficient documentation

## 2023-07-18 DIAGNOSIS — I2510 Atherosclerotic heart disease of native coronary artery without angina pectoris: Secondary | ICD-10-CM | POA: Insufficient documentation

## 2023-07-18 NOTE — Progress Notes (Signed)
Cardiology Office Note  Date: 07/18/2023   ID: Thomas Lindsey 1956/01/14, MRN [id number]  PCP:  Billie Lade, MD  Cardiologist:  [name], MD Electrophysiologist:  None    History of Present Illness: Thomas Lindsey is a 67 y.o. male known to have HTN, DM 2, AAA 3 cm (stable but 04/2022 ultrasound aorta), PAD s/p bilateral common iliac artery stenting, minimal bilateral carotid artery stenosis (1 to 39% bilaterally in 04/2022) is here for follow-up visit.  Patient underwent multiple CAT scans of his chest and abdomen in the last 1 year which showed calcification/atherosclerosis of left main, LAD and LCx coronary vessels. The severity of calcifications was not quantified. He is currently on Plavix 75 mg once daily and atorvastatin 80 mg nightly. CT coronary calcium score is 732 which is 86 percentile for age and sex matched control. He also has aortic valve, mitral annular calcification, aortic atherosclerosis and scattered pericardial calcification.  He is here for follow-up visit.  Denies any angina, DOE, dizziness, presyncope, syncope, palpitations and leg swelling.  Past Medical History:  Diagnosis Date   Aortic disorder (HCC)    Arthritis    Cervical radiculitis    Depression    Diabetes mellitus without complication (HCC)    Elevated glucose    GERD (gastroesophageal reflux disease)    Hypertension    Insomnia    Obstructive sleep apnea    Peripheral arterial disease (HCC)    Saccular aneurysm    Tinnitus of both ears     Past Surgical History:  Procedure Laterality Date   BACK SURGERY     STENT PLACEMENT ILIAC ([facility] HX) Bilateral    placed aproximately 17 years    Current Outpatient Medications  Medication Sig Dispense Refill   amLODipine (NORVASC) 10 MG tablet Take 1 tablet (10 mg total) by mouth daily. 90 tablet 1   ascorbic acid (VITAMIN C) 500 MG tablet Take 1 tablet (500 mg total) by mouth daily. 90 tablet 3   atorvastatin (LIPITOR) 80 MG  tablet Take 1 tablet (80 mg total) by mouth daily. 90 tablet 3   clopidogrel (PLAVIX) 75 MG tablet Take 1 tablet (75 mg total) by mouth daily. 90 tablet 1   cyclobenzaprine (FLEXERIL) 5 MG tablet Take 1 tablet (5 mg total) by mouth 2 (two) times daily as needed for muscle spasms. 30 tablet 0   ezetimibe (ZETIA) 10 MG tablet Take 1 tablet (10 mg total) by mouth daily. 90 tablet 1   glucose blood (ACCU-CHEK GUIDE) test strip Check blood sugar once daily. 100 each 2   lisinopril (ZESTRIL) 5 MG tablet Take 1 tablet (5 mg total) by mouth daily. 90 tablet 3   meloxicam (MOBIC) 15 MG tablet Take 1 tablet (15 mg total) by mouth daily. 90 tablet 0   metFORMIN (GLUCOPHAGE) 500 MG tablet Take 1 tablet (500 mg total) by mouth 2 (two) times daily with a meal. 180 tablet 1   metoprolol succinate (TOPROL-XL) 100 MG 24 hr tablet Take 1 tablet (100 mg total) by mouth daily. Take with or immediately following a meal. 90 tablet 3   Omega-3 Fatty Acids (FISH OIL) 1000 MG CAPS Take 1 capsule (1,000 mg total) by mouth daily. 90 capsule 3   pantoprazole (PROTONIX) 40 MG tablet TAKE 1 TABLET DAILY 90 tablet 3   spironolactone (ALDACTONE) 50 MG tablet TAKE 1 TABLET TWICE A DAY 180 tablet 3   zolpidem (AMBIEN) 10 MG tablet TAKE 1 TABLET AT  BEDTIME 30 tablet 0   diclofenac Sodium (VOLTAREN) 1 % GEL APPLY 2 GRAMS TOPICALLY DAILY AS NEEDED FOR PAIN 100 g 6   No current facility-administered medications for this visit.   Allergies:  Hctz [hydrochlorothiazide]   Social History: The patient  reports that he quit smoking about 16 years ago. His smoking use included cigarettes. He has never used smokeless tobacco. He reports that he does not currently use alcohol. He reports that he does not currently use drugs.   Family History: The patient's family history includes Heart disease in his father; Hyperlipidemia in his father; Hypertension in his father; Kidney disease in his father.   ROS:  Please see the history of present  illness. Otherwise, complete review of systems is positive for none.  All other systems are reviewed and negative.   Physical Exam: VS:  BP 138/74   Pulse 74   Ht 6\' 2"  (1.88 m)   Wt 262 lb (118.8 kg)   SpO2 99%   BMI 33.64 kg/m , BMI Body mass index is 33.64 kg/m.  Wt Readings from Last 3 Encounters:  07/18/23 262 lb (118.8 kg)  07/16/23 255 lb (115.7 kg)  06/14/23 260 lb (117.9 kg)    General: Patient appears comfortable at rest. HEENT: Conjunctiva and lids normal, oropharynx clear with moist mucosa. Neck: Supple, no elevated JVP or carotid bruits, no thyromegaly. Lungs: Clear to auscultation, nonlabored breathing at rest. Cardiac: Regular rate and rhythm, no S3 or significant systolic murmur, no pericardial rub. Abdomen: Soft, nontender, no hepatomegaly, bowel sounds present, no guarding or rebound. Extremities: No pitting edema, distal pulses 2+. Skin: Warm and dry. Musculoskeletal: No kyphosis. Neuropsychiatric: Alert and oriented x3, affect grossly appropriate.  ECG:  An ECG dated 07/16/2022 was personally reviewed today and demonstrated:  NSR and no ST-T changes  Recent Labwork: 08/05/2022: BUN 23; Creatinine, Ser 1.03; Potassium 5.1; Sodium 136     Component Value Date/Time   CHOL 131 10/19/2022 1348   TRIG 143 10/19/2022 1348   HDL 51 10/19/2022 1348   CHOLHDL 2.6 10/19/2022 1348   LDLCALC 55 10/19/2022 1348     Assessment and Plan: Patient is a 67 year old male known to have HTN, DM 2, AAA 3 cm (stable but 04/2022 ultrasound aorta), PAD s/p bilateral common iliac artery stenting, minimal bilateral carotid artery stenosis (1 to 39% bilaterally in 04/2022) is here for follow-up visit.  # Elevated coronary calcium score 732 -Asymptomatic, no angina or DOE. We discussed about performing NM stress test for risk stratification and to rule out any high risk findings. Patient hesitant at this time as his best friend passed away during the stress test. Will defer NM stress  test until he is symptomatic.  Precautions for chest pain provided. Continue aspirin 81 mg once daily and atorvastatin 80 mg nightly.  Will obtain 2D echocardiogram.  # HLD, at goal -Continue atorvastatin 80 mg nightly and Zetia 10 mg once daily, LDL 55 in 2023.  # HTN, controlled -Continue current antihypertensives, amlodipine 10 mg once daily, lisinopril 5 mg once daily, metoprolol succinate 100 mg once daily and spironolactone 50 mg twice daily.  Follows with PCP.  # Lower extremity PAD s/p bilateral common iliac artery stenting, claudication free -On chronic Plavix monotherapy, continue high intensity statin and Zetia 10 mg once daily.  Follows up with vascular surgery.  # AAA, 3.1 cm -Continue aspirin and high intensity statin.  BP well-controlled.  Follows up with vascular surgery.  Quit smoking around 15 years  ago.   Medication Adjustments/Labs and Tests Ordered: Current medicines are reviewed at length with the patient today.  Concerns regarding medicines are outlined above.    Disposition:  Follow up 1 year  Signed, Lamarcus Spira Verne Spurr, MD, 07/18/2023 8:43 AM    [facility] at [facility] [address], [place], [place] [address]

## 2023-07-18 NOTE — Patient Instructions (Signed)
Medication Instructions:  Your physician recommends that you continue on your current medications as directed. Please refer to the Current Medication list given to you today.  *If you need a refill on your cardiac medications before your next appointment, please call your pharmacy*   Lab Work: None If you have labs (blood work) drawn today and your tests are completely normal, you will receive your results only by: [facility] (if you have MyChart) OR A paper copy in the mail If you have any lab test that is abnormal or we need to change your treatment, we will call you to review the results.   Testing/Procedures: Your physician has requested that you have an echocardiogram. Echocardiography is a painless test that uses sound waves to create images of your heart. It provides your doctor with information about the size and shape of your heart and how well your heart's chambers and valves are working. This procedure takes approximately one hour. There are no restrictions for this procedure. Please do NOT wear cologne, perfume, aftershave, or lotions (deodorant is allowed). Please arrive 15 minutes prior to your appointment time.    Follow-Up: At [facility], you and your health needs are our priority.  As part of our continuing mission to provide you with exceptional heart care, we have created designated Provider Care Teams.  These Care Teams include your primary Cardiologist (physician) and Advanced Practice Providers (APPs -  Physician Assistants and Nurse Practitioners) who all work together to provide you with the care you need, when you need it.  We recommend signing up for the patient portal called "MyChart".  Sign up information is provided on this After Visit Summary.  MyChart is used to connect with patients for Virtual Visits (Telemedicine).  Patients are able to view lab/test results, encounter notes, upcoming appointments, etc.  Non-urgent messages can be sent to your  provider as well.   To learn more about what you can do with MyChart, go to [email or web address].    Your next appointment:   1 year(s)  Provider:   Claudina Lick, MD    Other Instructions

## 2023-07-22 ENCOUNTER — Ambulatory Visit (HOSPITAL_COMMUNITY)
Admission: RE | Admit: 2023-07-22 | Discharge: 2023-07-22 | Disposition: A | Discharge status: Home / Self Care | Payer: Medicare Other | Source: Ambulatory Visit | Attending: Internal Medicine | Admitting: Internal Medicine

## 2023-07-22 DIAGNOSIS — I2510 Atherosclerotic heart disease of native coronary artery without angina pectoris: Secondary | ICD-10-CM | POA: Diagnosis present

## 2023-07-22 LAB — ECHOCARDIOGRAM COMPLETE
AR max vel: 2 cm2
AV Area VTI: 1.96 cm2
AV Area mean vel: 1.93 cm2
AV Mean grad: 5 mmHg
AV Peak grad: 10.9 mmHg
Ao pk vel: 1.65 m/s
Area-P 1/2: 2.47 cm2
MV VTI: 3.08 cm2
S' Lateral: 3.1 cm

## 2023-07-22 NOTE — Progress Notes (Signed)
*  PRELIMINARY RESULTS* Echocardiogram 2D Echocardiogram has been performed.  Thomas Lindsey 07/22/2023, 9:27 AM

## 2023-07-25 ENCOUNTER — Encounter: Payer: Self-pay | Admitting: Internal Medicine

## 2023-07-25 ENCOUNTER — Ambulatory Visit (INDEPENDENT_AMBULATORY_CARE_PROVIDER_SITE_OTHER): Payer: Medicare Other | Admitting: Internal Medicine

## 2023-07-25 VITALS — BP 111/61 | HR 69 | Ht 74.0 in | Wt 260.8 lb

## 2023-07-25 DIAGNOSIS — Z79899 Other long term (current) drug therapy: Secondary | ICD-10-CM

## 2023-07-25 DIAGNOSIS — I10 Essential (primary) hypertension: Secondary | ICD-10-CM

## 2023-07-25 DIAGNOSIS — Z6833 Body mass index (BMI) 33.0-33.9, adult: Secondary | ICD-10-CM

## 2023-07-25 DIAGNOSIS — Z7984 Long term (current) use of oral hypoglycemic drugs: Secondary | ICD-10-CM

## 2023-07-25 DIAGNOSIS — G4700 Insomnia, unspecified: Secondary | ICD-10-CM | POA: Insufficient documentation

## 2023-07-25 DIAGNOSIS — F5104 Psychophysiologic insomnia: Secondary | ICD-10-CM

## 2023-07-25 DIAGNOSIS — E118 Type 2 diabetes mellitus with unspecified complications: Secondary | ICD-10-CM | POA: Diagnosis not present

## 2023-07-25 DIAGNOSIS — E782 Mixed hyperlipidemia: Secondary | ICD-10-CM

## 2023-07-25 MED ORDER — SPIRONOLACTONE 50 MG PO TABS: 50.0000 mg | ORAL_TABLET | Freq: Two times a day (BID) | ORAL | 3 refills | Status: DC

## 2023-07-25 MED ORDER — LISINOPRIL 5 MG PO TABS
5.0000 mg | ORAL_TABLET | Freq: Every day | ORAL | 3 refills | Status: DC
Start: 2023-07-25 — End: 2024-01-25

## 2023-07-25 MED ORDER — METOPROLOL SUCCINATE ER 100 MG PO TB24: 100.0000 mg | ORAL_TABLET | Freq: Every day | ORAL | 3 refills | Status: DC

## 2023-07-25 MED ORDER — ATORVASTATIN CALCIUM 80 MG PO TABS
80.0000 mg | ORAL_TABLET | Freq: Every day | ORAL | 3 refills | Status: DC
Start: 2023-07-25 — End: 2024-01-25

## 2023-07-25 MED ORDER — EZETIMIBE 10 MG PO TABS
10.0000 mg | ORAL_TABLET | Freq: Every day | ORAL | 1 refills | Status: DC
Start: 2023-07-25 — End: 2023-08-28

## 2023-07-25 MED ORDER — METFORMIN HCL 500 MG PO TABS
500.0000 mg | ORAL_TABLET | Freq: Two times a day (BID) | ORAL | 1 refills | Status: DC
Start: 2023-07-25 — End: 2024-01-25

## 2023-07-25 MED ORDER — AMLODIPINE BESYLATE 10 MG PO TABS: 10.0000 mg | ORAL_TABLET | Freq: Every day | ORAL | 1 refills | Status: DC

## 2023-07-25 MED ORDER — FISH OIL 1000 MG PO CAPS: 1000.0000 mg | ORAL_CAPSULE | Freq: Every day | ORAL | 3 refills | Status: AC

## 2023-07-25 MED ORDER — CLOPIDOGREL BISULFATE 75 MG PO TABS: 75.0000 mg | ORAL_TABLET | Freq: Every day | ORAL | 1 refills | Status: DC

## 2023-07-25 MED ORDER — PANTOPRAZOLE SODIUM 40 MG PO TBEC: 40.0000 mg | DELAYED_RELEASE_TABLET | Freq: Every day | ORAL | 3 refills | Status: DC

## 2023-07-25 NOTE — Patient Instructions (Signed)
It was a pleasure to see you today.  Thank you for giving [place] the opportunity to be involved in your care.  Below is a brief recap of your visit and next steps.  We will plan to see you again in 6 months.  Summary No medication changes today Repeat labs ordered Follow up in 6 months

## 2023-07-25 NOTE — Assessment & Plan Note (Signed)
HTN is well-controlled on current antihypertensive regimen consisting of amlodipine 10 mg daily, lisinopril 5 mg daily, Toprol-XL 100 mg daily, and spironolactone 50 mg daily.  No medication changes are indicated today.

## 2023-07-25 NOTE — Assessment & Plan Note (Signed)
Adequately treated with Ambien 10 mg nightly.  PDMP reviewed and is appropriate.  Controlled substance agreement signed today.  UDS pending.

## 2023-07-25 NOTE — Progress Notes (Signed)
Established Patient Office Visit  Subjective   Patient ID: Thomas Lindsey, male    DOB: [birth year]  Age: 67 y.o. MRN: [id number]  Chief Complaint  Patient presents with   Diabetes    Follow up    Thomas Lindsey returns to care today for routine follow-up.  He was last evaluated by me on 2/16.  No medication changes were made at that time and 6-month follow-up was arranged.  In the interim, he presented to the emergency department earlier this month endorsing left eye discomfort.  Diagnosed with corneal abrasion.  He has also been evaluated by cardiology and underwent TTE last week.  There have otherwise been no acute interval events. Thomas Lindsey reports feeling well today.  He is asymptomatic and has no acute concerns to discuss.  He has lost 25 pounds since his last appointment with lifestyle changes.  Past Medical History:  Diagnosis Date   Aortic disorder (HCC)    Arthritis    Cervical radiculitis    Depression    Diabetes mellitus without complication (HCC)    Elevated glucose    GERD (gastroesophageal reflux disease)    Hypertension    Insomnia    Obstructive sleep apnea    Peripheral arterial disease (HCC)    Saccular aneurysm    Tinnitus of both ears    Past Surgical History:  Procedure Laterality Date   BACK SURGERY     STENT PLACEMENT ILIAC ([facility] HX) Bilateral    placed aproximately 17 years   Social History   Tobacco Use   Smoking status: Former    Current packs/day: 0.00    Types: Cigarettes    Quit date: 06/14/2007    Years since quitting: 16.1   Smokeless tobacco: Never  Vaping Use   Vaping status: Never Used  Substance Use Topics   Alcohol use: Not Currently    Comment: Occasional   Drug use: Not Currently   Family History  Problem Relation Age of Onset   Heart disease Father    Hypertension Father    Hyperlipidemia Father    Kidney disease Father    Allergies  Allergen Reactions   Hctz [Hydrochlorothiazide] Other (See Comments)    Dizzy    Review of Systems  Constitutional:  Negative for chills and fever.  HENT:  Negative for sore throat.   Respiratory:  Negative for cough and shortness of breath.   Cardiovascular:  Negative for chest pain, palpitations and leg swelling.  Gastrointestinal:  Negative for abdominal pain, blood in stool, constipation, diarrhea, nausea and vomiting.  Genitourinary:  Negative for dysuria and hematuria.  Musculoskeletal:  Negative for myalgias.  Skin:  Negative for itching and rash.  Neurological:  Negative for dizziness and headaches.  Psychiatric/Behavioral:  Negative for depression and suicidal ideas.       Objective:     BP 111/61   Pulse 69   Ht 6\' 2"  (1.88 m)   Wt 260 lb 12.8 oz (118.3 kg)   SpO2 98%   BMI 33.48 kg/m  BP Readings from Last 3 Encounters:  07/25/23 111/61  07/18/23 138/74  07/16/23 137/68   Physical Exam Vitals reviewed.  Constitutional:      General: He is not in acute distress.    Appearance: Normal appearance. He is obese. He is not ill-appearing.  HENT:     Head: Normocephalic and atraumatic.     Right Ear: External ear normal.     Left Ear: External ear normal.  Nose: Nose normal. No congestion or rhinorrhea.     Mouth/Throat:     Mouth: Mucous membranes are moist.     Pharynx: Oropharynx is clear.  Eyes:     General: No scleral icterus.    Extraocular Movements: Extraocular movements intact.     Conjunctiva/sclera: Conjunctivae normal.     Pupils: Pupils are equal, round, and reactive to light.  Cardiovascular:     Rate and Rhythm: Normal rate and regular rhythm.     Pulses: Normal pulses.     Heart sounds: Normal heart sounds. No murmur heard. Pulmonary:     Effort: Pulmonary effort is normal.     Breath sounds: Normal breath sounds. No wheezing, rhonchi or rales.  Abdominal:     General: Abdomen is flat. Bowel sounds are normal. There is no distension.     Palpations: Abdomen is soft.     Tenderness: There is no abdominal  tenderness.  Musculoskeletal:        General: No swelling or deformity. Normal range of motion.     Cervical back: Normal range of motion.  Skin:    General: Skin is warm and dry.     Capillary Refill: Capillary refill takes less than 2 seconds.  Neurological:     General: No focal deficit present.     Mental Status: He is alert and oriented to person, place, and time.     Motor: No weakness.  Psychiatric:        Mood and Affect: Mood normal.        Behavior: Behavior normal.        Thought Content: Thought content normal.    Diabetic foot exam was performed.  No deformities or other abnormal visual findings.  Posterior tibialis and dorsalis pulse intact bilaterally.  Intact to touch and monofilament testing bilaterally.    Last CBC Lab Results  Component Value Date   WBC 12.5 (H) 07/16/2022   HGB 13.7 07/16/2022   HCT 40.4 07/16/2022   MCV 89.2 07/16/2022   MCH 30.2 07/16/2022   RDW 12.9 07/16/2022   PLT 264 07/16/2022   Last metabolic panel Lab Results  Component Value Date   GLUCOSE 136 (H) 08/05/2022   NA 136 08/05/2022   K 5.1 08/05/2022   CL 97 08/05/2022   CO2 23 08/05/2022   BUN 23 08/05/2022   CREATININE 1.03 08/05/2022   EGFR 80 08/05/2022   CALCIUM 10.1 08/05/2022   PROT 7.8 07/16/2022   ALBUMIN 4.5 07/16/2022   LABGLOB 2.7 04/27/2022   AGRATIO 1.8 04/27/2022   BILITOT 0.5 07/16/2022   ALKPHOS 49 07/16/2022   AST 19 07/16/2022   ALT 26 07/16/2022   ANIONGAP 9 07/16/2022   Last lipids Lab Results  Component Value Date   CHOL 131 10/19/2022   HDL 51 10/19/2022   LDLCALC 55 10/19/2022   TRIG 143 10/19/2022   CHOLHDL 2.6 10/19/2022   Last hemoglobin A1c Lab Results  Component Value Date   HGBA1C 6.9 (H) 01/20/2023    The 10-year ASCVD risk score (Arnett DK, et al., 2019) is: 18.9%    Assessment & Plan:   Problem List Items Addressed This Visit       Hypertension (Chronic)    HTN is well-controlled on current antihypertensive  regimen consisting of amlodipine 10 mg daily, lisinopril 5 mg daily, Toprol-XL 100 mg daily, and spironolactone 50 mg daily.  No medication changes are indicated today.      Controlled diabetes mellitus type 2  with complications (HCC)    A1c 6.9 on labs from February.  He is currently prescribed metformin 500 mg twice daily.  He has lost 25 pounds since his last appointment. -Repeat A1c and urine microalbumin/creatinine ratio ordered today -Diabetic foot exam completed today      Hyperlipidemia (Chronic)    Currently prescribed atorvastatin 80 mg daily, Zetia 10 mg daily, and fish oil supplementation.  Repeat lipid panel ordered today.      Insomnia    Adequately treated with Ambien 10 mg nightly.  PDMP reviewed and is appropriate.  Controlled substance agreement signed today.  UDS pending.      Return in about 6 months (around 01/25/2024).   Billie Lade, MD

## 2023-07-25 NOTE — Assessment & Plan Note (Signed)
A1c 6.9 on labs from February.  He is currently prescribed metformin 500 mg twice daily.  He has lost 25 pounds since his last appointment. -Repeat A1c and urine microalbumin/creatinine ratio ordered today -Diabetic foot exam completed today

## 2023-07-25 NOTE — Assessment & Plan Note (Signed)
Currently prescribed atorvastatin 80 mg daily, Zetia 10 mg daily, and fish oil supplementation.  Repeat lipid panel ordered today.

## 2023-07-27 LAB — LIPID PANEL
Chol/HDL Ratio: 2.4 ratio (ref 0.0–5.0)
Cholesterol, Total: 123 mg/dL (ref 100–199)
HDL: 51 mg/dL (ref >39)
LDL Chol Calc (NIH): 48 mg/dL (ref 0–99)
Triglycerides: 140 mg/dL (ref 0–149)
VLDL Cholesterol Cal: 24 mg/dL (ref 5–40)

## 2023-07-27 LAB — HEMOGLOBIN A1C
Est. average glucose Bld gHb Est-mCnc: 143 mg/dL
Hgb A1c MFr Bld: 6.6 % — ABNORMAL HIGH (ref 4.8–5.6)

## 2023-07-27 LAB — CBC WITH DIFFERENTIAL/PLATELET
Basophils Absolute: 0 10*3/uL (ref 0.0–0.2)
Basos: 1 %
EOS (ABSOLUTE): 0.3 10*3/uL (ref 0.0–0.4)
Eos: 3 %
Hematocrit: 40.9 % (ref 37.5–51.0)
Hemoglobin: 13.9 g/dL (ref 13.0–17.7)
Immature Grans (Abs): 0 10*3/uL (ref 0.0–0.1)
Immature Granulocytes: 0 %
Lymphocytes Absolute: 2.9 10*3/uL (ref 0.7–3.1)
Lymphs: 35 %
MCH: 31 pg (ref 26.6–33.0)
MCHC: 34 g/dL (ref 31.5–35.7)
MCV: 91 fL (ref 79–97)
Monocytes Absolute: 0.6 10*3/uL (ref 0.1–0.9)
Monocytes: 7 %
Neutrophils Absolute: 4.5 10*3/uL (ref 1.4–7.0)
Neutrophils: 54 %
Platelets: 289 10*3/uL (ref 150–450)
RBC: 4.49 x10E6/uL (ref 4.14–5.80)
RDW: 12.9 % (ref 11.6–15.4)
WBC: 8.3 10*3/uL (ref 3.4–10.8)

## 2023-07-27 LAB — CMP14+EGFR
ALT: 21 IU/L (ref 0–44)
AST: 18 IU/L (ref 0–40)
Albumin: 4.8 g/dL (ref 3.9–4.9)
Alkaline Phosphatase: 53 IU/L (ref 44–121)
BUN/Creatinine Ratio: 18 (ref 10–24)
BUN: 17 mg/dL (ref 8–27)
Bilirubin Total: 0.4 mg/dL (ref 0.0–1.2)
CO2: 22 mmol/L (ref 20–29)
Calcium: 9.7 mg/dL (ref 8.6–10.2)
Chloride: 101 mmol/L (ref 96–106)
Creatinine, Ser: 0.96 mg/dL (ref 0.76–1.27)
Globulin, Total: 2 g/dL (ref 1.5–4.5)
Glucose: 103 mg/dL — ABNORMAL HIGH (ref 70–99)
Potassium: 5 mmol/L (ref 3.5–5.2)
Sodium: 138 mmol/L (ref 134–144)
Total Protein: 6.8 g/dL (ref 6.0–8.5)
eGFR: 87 mL/min/{1.73_m2} (ref >59)

## 2023-07-27 LAB — MICROALBUMIN / CREATININE URINE RATIO
Creatinine, Urine: 87.7 mg/dL
Microalb/Creat Ratio: 10 mg/g{creat} (ref 0–29)
Microalbumin, Urine: 8.4 ug/mL

## 2023-07-27 LAB — TSH+FREE T4
Free T4: 1.22 ng/dL (ref 0.82–1.77)
TSH: 0.759 u[IU]/mL (ref 0.450–4.500)

## 2023-07-27 LAB — B12 AND FOLATE PANEL
Folate: 17.5 ng/mL (ref >3.0)
Vitamin B-12: 643 pg/mL (ref 232–1245)

## 2023-07-27 LAB — VITAMIN D 25 HYDROXY (VIT D DEFICIENCY, FRACTURES): Vit D, 25-Hydroxy: 37.2 ng/mL (ref 30.0–100.0)

## 2023-07-29 LAB — TOXASSURE SELECT 13 (MW), URINE

## 2023-08-01 ENCOUNTER — Encounter (HOSPITAL_BASED_OUTPATIENT_CLINIC_OR_DEPARTMENT_OTHER): Payer: Self-pay

## 2023-08-05 ENCOUNTER — Other Ambulatory Visit: Payer: Self-pay | Admitting: Internal Medicine

## 2023-08-05 DIAGNOSIS — G4700 Insomnia, unspecified: Secondary | ICD-10-CM

## 2023-08-11 ENCOUNTER — Ambulatory Visit: Payer: Medicare Other | Admitting: Pulmonary Disease

## 2023-08-28 ENCOUNTER — Other Ambulatory Visit: Payer: Self-pay | Admitting: Internal Medicine

## 2023-08-28 DIAGNOSIS — E782 Mixed hyperlipidemia: Secondary | ICD-10-CM

## 2023-08-31 ENCOUNTER — Encounter: Payer: Self-pay | Admitting: Nutrition

## 2023-08-31 NOTE — Patient Instructions (Signed)
Goals Established by Pt Goals  Think about what  you eat, time of meals and portion Eat three meals per day at times discussed Cut down on high sodium foods Test blood sugars twice a day [employer]  or What the HEALTH or We are what we eat.

## 2023-09-06 ENCOUNTER — Other Ambulatory Visit: Payer: Self-pay | Admitting: Internal Medicine

## 2023-09-06 DIAGNOSIS — G4700 Insomnia, unspecified: Secondary | ICD-10-CM

## 2023-09-26 ENCOUNTER — Encounter: Payer: Self-pay | Admitting: Nutrition

## 2023-09-26 ENCOUNTER — Encounter: Payer: Medicare Other | Attending: Internal Medicine | Admitting: Nutrition

## 2023-09-26 DIAGNOSIS — Z7984 Long term (current) use of oral hypoglycemic drugs: Secondary | ICD-10-CM | POA: Insufficient documentation

## 2023-09-26 DIAGNOSIS — E1165 Type 2 diabetes mellitus with hyperglycemia: Secondary | ICD-10-CM | POA: Insufficient documentation

## 2023-09-26 NOTE — Patient Instructions (Signed)
Goals Keep up the great job!! Keep eating whole plant based foods Aim for 150 minutes of physical activity weekly. Drink 80 oz of water per day

## 2023-09-26 NOTE — Progress Notes (Signed)
Medical Nutrition Therapy  Appointment Start time:  1030  Appointment End time:  29528  Primary concerns today: Dm Type 2  Referral diagnosis: E11.8 Preferred learning style: Read  Learning readiness: Ready    NUTRITION ASSESSMENT FOllow up  FBS:115 mg/dl. A1C down to down to 6.6 from 6.9%. Had cut back on salt a lot.  Lost 11 lbs in the last 4 months. Wt down from 172 lbs to 161 lbs. Eating more whole plant based foods and doing an excellent job with lifestyle medicine. Working outside for exercise. Making excellent progress. Metformin 500 mg BID. Changes made:  Think about what  you eat, time of meals and portion-done Eat three meals per day at times discussed-done Cut down on high sodium foods-done Test blood sugars twice a day-done [employer]  or What the HEALTH or We are what we eat.-done  Lab Results  Component Value Date   HGBA1C 6.6 (H) 07/25/2023      Latest Ref Rng & Units 07/25/2023    1:40 PM 08/05/2022    8:58 AM 07/16/2022   10:35 PM  CMP  Glucose 70 - 99 mg/dL 413  244  010   BUN 8 - 27 mg/dL 17  23  25    Creatinine 0.76 - 1.27 mg/dL 2.72  5.36  6.44   Sodium 134 - 144 mmol/L 138  136  137   Potassium 3.5 - 5.2 mmol/L 5.0  5.1  4.1   Chloride 96 - 106 mmol/L 101  97  103   CO2 20 - 29 mmol/L 22  23  25    Calcium 8.6 - 10.2 mg/dL 9.7  03.4  9.5   Total Protein 6.0 - 8.5 g/dL 6.8   7.8   Total Bilirubin 0.0 - 1.2 mg/dL 0.4   0.5   Alkaline Phos 44 - 121 IU/L 53   49   AST 0 - 40 IU/L 18   19   ALT 0 - 44 IU/L 21   26    Lipid Panel     Component Value Date/Time   CHOL 123 07/25/2023 1340   TRIG 140 07/25/2023 1340   HDL 51 07/25/2023 1340   CHOLHDL 2.4 07/25/2023 1340   LDLCALC 48 07/25/2023 1340   LABVLDL 24 07/25/2023 1340    Anthropometrics  Wt Readings from Last 3 Encounters:  09/26/23 261 lb (118.4 kg)  07/25/23 260 lb 12.8 oz (118.3 kg)  07/18/23 262 lb (118.8 kg)   Ht Readings from Last 3 Encounters:  09/26/23 6\' 4"  (1.93 m)   07/25/23 6\' 2"  (1.88 m)  07/18/23 6\' 2"  (1.88 m)   Body mass index is 31.77 kg/m. @BMIFA @ Facility age limit for growth %iles is 20 years. Facility age limit for growth %iles is 20 years.   Clinical Medical Hx: See Chart Medications: Metformin 500 mg BID. See chart Labs: A1C 6.9% Notable Signs/Symptoms: none  Lifestyle & Dietary Hx Married. Retired.  Estimated daily fluid: 40 oz Supplements: Vit C, Sleep: Takes ambien Stress / self-care: none Current average weekly physical activity: ADL  24-Hr Dietary Recall Eating more whole plant based foods Cut out processed meats and processed foods  Estimated Energy Needs Calories: 1600 Carbohydrate: 180g Protein: 120g Fat: 44g   NUTRITION DIAGNOSIS  NB-1.1 Food and nutrition-related knowledge deficit As related to Diabetes Type 2.  As evidenced by A1C 6.9%.   NUTRITION INTERVENTION  Nutrition education (E-1) on the following topics:  Nutrition and Diabetes education provided on My Plate, CHO  counting, meal planning, portion sizes, timing of meals, avoiding snacks between meals unless having a low blood sugar, target ranges for A1C and blood sugars, signs/symptoms and treatment of hyper/hypoglycemia, monitoring blood sugars, taking medications as prescribed, benefits of exercising 30 minutes per day and prevention of complications of DM.  Lifestyle Medicine  - Whole Food, Plant Predominant Nutrition is highly recommended: Eat Plenty of vegetables, Mushrooms, fruits, Legumes, Whole Grains, Nuts, seeds in lieu of processed meats, processed snacks/pastries red meat, poultry, eggs.    -It is better to avoid simple carbohydrates including: Cakes, Sweet Desserts, Ice Cream, Soda (diet and regular), Sweet Tea, Candies, Chips, Cookies, Store Bought Juices, Alcohol in Excess of  1-2 drinks a day, Lemonade,  Artificial Sweeteners, Doughnuts, Coffee Creamers, "Sugar-free" Products, etc, etc.  This is not a complete list.....  Exercise:  If you are able: 30 -60 minutes a day ,4 days a week, or 150 minutes a week.  The longer the better.  Combine stretch, strength, and aerobic activities.  If you were told in the past that you have high risk for cardiovascular diseases, you may seek evaluation by your heart doctor prior to initiating moderate to intense exercise programs.   Handouts Provided Include  Know your numbers Lifestyle Medicine  Learning Style & Readiness for Change Teaching method utilized: Visual & Auditory  Demonstrated degree of understanding via: Teach Back  Barriers to learning/adherence to lifestyle change: none  Goals Established by Pt Goals Keep up the great job!! Keep eating whole plant based foods Aim for 150 minutes of physical activity weekly. Drink 80 oz of water per day   MONITORING & EVALUATION Dietary intake, weekly physical activity, and weight in 4 month.  Recommend to follow up on lipids and see if any other medications for HTN and Hyperlipidemia can be reduced with the lifestyle changes he has made.  Next Steps  Keep up the great job!!

## 2023-10-02 ENCOUNTER — Other Ambulatory Visit: Payer: Self-pay | Admitting: Internal Medicine

## 2023-10-02 DIAGNOSIS — G4700 Insomnia, unspecified: Secondary | ICD-10-CM

## 2023-10-04 ENCOUNTER — Encounter: Payer: Self-pay | Admitting: Primary Care

## 2023-10-04 ENCOUNTER — Ambulatory Visit (INDEPENDENT_AMBULATORY_CARE_PROVIDER_SITE_OTHER): Payer: Medicare Other | Admitting: Primary Care

## 2023-10-04 VITALS — BP 123/68 | HR 66 | Ht 76.0 in | Wt 257.4 lb

## 2023-10-04 DIAGNOSIS — G4733 Obstructive sleep apnea (adult) (pediatric): Secondary | ICD-10-CM

## 2023-10-04 NOTE — Assessment & Plan Note (Signed)
-   Mild OSA>> AHI 13/hour. Patient is 100% compliant with CPAP use >4 hours last 30 days. Sleep apnea is well-controlled on current pressure setting 5-10cm h20; Residual AHI 3.2/hour. No changes needed today. Advised patient continue wear CPAP nightly 4 to 6 hours or longer and work on weight loss. Renew CPAP supplies with [facility]  Follow-up 1 year with [name], NP in [place]

## 2023-10-04 NOTE — Patient Instructions (Addendum)
Nice to meet you Thomas Lindsey Sleep apnea is well-controlled on current pressure setting 5 to 10 cm H2O No changes needed today Continue wear CPAP nightly 4 to 6 hours or longer We will renew CPAP supplies with [facility]  Follow-up 1 year with Thomas Sandy, NP in [place]  CPAP and BIPAP Information CPAP and BIPAP are methods that use air pressure to keep your airways open and to help you breathe well. CPAP and BIPAP use different amounts of pressure. Your health care provider will tell you whether CPAP or BIPAP would be more helpful for you. CPAP stands for "continuous positive airway pressure." With CPAP, the amount of pressure stays the same while you breathe in (inhale) and out (exhale). BIPAP stands for "bi-level positive airway pressure." With BIPAP, the amount of pressure will be higher when you inhale and lower when you exhale. This allows you to take larger breaths. CPAP or BIPAP may be used in the hospital, or your health care provider may want you to use it at home. You may need to have a sleep study before your health care provider can order a machine for you to use at home. What are the advantages? CPAP or BIPAP can be helpful if you have: Sleep apnea. Chronic obstructive pulmonary disease (COPD). Heart failure. Medical conditions that cause muscle weakness, including muscular dystrophy or amyotrophic lateral sclerosis (ALS). Other problems that cause breathing to be shallow, weak, abnormal, or difficult. CPAP and BIPAP are most commonly used for obstructive sleep apnea (OSA) to keep the airways from collapsing when the muscles relax during sleep. What are the risks? Generally, this is a safe treatment. However, problems may occur, including: Irritated skin or skin sores if the mask does not fit properly. Dry or stuffy nose or nosebleeds. Dry mouth. Feeling gassy or bloated. Sinus or lung infection if the equipment is not cleaned properly. When should CPAP or BIPAP be used? In most  cases, the mask only needs to be worn during sleep. Generally, the mask needs to be worn throughout the night and during any daytime naps. People with certain medical conditions may also need to wear the mask at other times, such as when they are awake. Follow instructions from your health care provider about when to use the machine. What happens during CPAP or BIPAP?  Both CPAP and BIPAP are provided by a small machine with a flexible plastic tube that attaches to a plastic mask that you wear. Air is blown through the mask into your nose or mouth. The amount of pressure that is used to blow the air can be adjusted on the machine. Your health care provider will set the pressure setting and help you find the best mask for you. Tips for using the mask Because the mask needs to be snug, some people feel trapped or closed-in (claustrophobic) when first using the mask. If you feel this way, you may need to get used to the mask. One way to do this is to hold the mask loosely over your nose or mouth and then gradually apply the mask more snugly. You can also gradually increase the amount of time that you use the mask. Masks are available in various types and sizes. If your mask does not fit well, talk with your health care provider about getting a different one. Some common types of masks include: Full face masks, which fit over the mouth and nose. Nasal masks, which fit over the nose. Nasal pillow or prong masks, which fit into  the nostrils. If you are using a mask that fits over your nose and you tend to breathe through your mouth, a chin strap may be applied to help keep your mouth closed. Use a skin barrier to protect your skin as told by your health care provider. Some CPAP and BIPAP machines have alarms that may sound if the mask comes off or develops a leak. If you have trouble with the mask, it is very important that you talk with your health care provider about finding a way to make the mask easier to  tolerate. Do not stop using the mask. There could be a negative impact on your health if you stop using the mask. Tips for using the machine Place your CPAP or BIPAP machine on a secure table or stand near an electrical outlet. Know where the on/off switch is on the machine. Follow instructions from your health care provider about how to set the pressure on your machine and when you should use it. Do not eat or drink while the CPAP or BIPAP machine is on. Food or fluids could get pushed into your lungs by the pressure of the CPAP or BIPAP. For home use, CPAP and BIPAP machines can be rented or purchased through home health care companies. Many different brands of machines are available. Renting a machine before purchasing may help you find out which particular machine works well for you. Your health insurance company may also decide which machine you may get. Keep the CPAP or BIPAP machine and attachments clean. Ask your health care provider for specific instructions. Check the humidifier if you have a dry stuffy nose or nosebleeds. Make sure it is working correctly. Follow these instructions at home: Take over-the-counter and prescription medicines only as told by your health care provider. Ask if you can take sinus medicine if your sinuses are blocked. Do not use any products that contain nicotine or tobacco. These products include cigarettes, chewing tobacco, and vaping devices, such as e-cigarettes. If you need help quitting, ask your health care provider. Keep all follow-up visits. This is important. Contact a health care provider if: You have redness or pressure sores on your head, face, mouth, or nose from the mask or head gear. You have trouble using the CPAP or BIPAP machine. You cannot tolerate wearing the CPAP or BIPAP mask. Someone tells you that you snore even when wearing your CPAP or BIPAP. Get help right away if: You have trouble breathing. You feel confused. Summary CPAP and  BIPAP are methods that use air pressure to keep your airways open and to help you breathe well. If you have trouble with the mask, it is very important that you talk with your health care provider about finding a way to make the mask easier to tolerate. Do not stop using the mask. There could be a negative impact to your health if you stop using the mask. Follow instructions from your health care provider about when to use the machine. This information is not intended to replace advice given to you by your health care provider. Make sure you discuss any questions you have with your health care provider. Document Revised: 06/30/2021 Document Reviewed: 10/30/2020 Elsevier Patient Education  2023 [employer].

## 2023-10-04 NOTE — Progress Notes (Signed)
@Patient  ID: Thomas Lindsey, male    DOB: 01/12/1956, 67 y.o.   MRN: [id number]  Chief Complaint  Patient presents with   Follow-up    Follow up ,  1 year follow up     Referring provider: Billie Lade, MD  HPI: 67 year old male, former smoker. PMH significant for HTN, diabetes, CAD, OSA on CPAP, type 2 diabetes, hyperlipidemia, obesity.   10/04/2023 Patient presents today for 1 year follow OSA. HST 05/2022 >> (snap) AHI 4% 13/hour lowest desaturation 82%, 71 minutes with saturation less than 88%. He is doing well. He is compliant with CPAP use. He reports benefit from CPAP use. Sleeping 7 hours a night. No significant daytime sleepiness. No issues with mask fit or pressure settings . Using full face mask. DME company is [employer] download 09/04/23- 10/03/23 30/30 days (100%) > 4 hours Average usage 7 hours 13 mins Pressure 5-10cm h20 Airleaks 12.8L/min AHI 3.2  Allergies  Allergen Reactions   Hctz [Hydrochlorothiazide] Other (See Comments)    Dizzy    Immunization History  Administered Date(s) Administered   Fluad Quad(high Dose 65+) 08/11/2022   Influenza-Unspecified 09/13/2021   Moderna Covid-19 Vaccine Bivalent Booster 60yrs & up 06/24/2021, 09/13/2021   Moderna SARS-COV2 Booster Vaccination 10/20/2020   PFIZER Comirnaty(Gray Top)Covid-19 Tri-Sucrose Vaccine 02/07/2020, 02/28/2020    Past Medical History:  Diagnosis Date   Aortic disorder (HCC)    Arthritis    Cervical radiculitis    Depression    Diabetes mellitus without complication (HCC)    Elevated glucose    GERD (gastroesophageal reflux disease)    Hypertension    Insomnia    Obstructive sleep apnea    Peripheral arterial disease (HCC)    Saccular aneurysm    Tinnitus of both ears     Tobacco History: Social History   Tobacco Use  Smoking Status Former   Current packs/day: 0.00   Types: Cigarettes   Quit date: 06/14/2007   Years since quitting: 16.3  Smokeless Tobacco Never    Counseling given: Not Answered   Outpatient Medications Prior to Visit  Medication Sig Dispense Refill   amLODipine (NORVASC) 10 MG tablet Take 1 tablet (10 mg total) by mouth daily. 90 tablet 1   ascorbic acid (VITAMIN C) 500 MG tablet Take 1 tablet (500 mg total) by mouth daily. 90 tablet 3   atorvastatin (LIPITOR) 80 MG tablet Take 1 tablet (80 mg total) by mouth daily. 90 tablet 3   clopidogrel (PLAVIX) 75 MG tablet Take 1 tablet (75 mg total) by mouth daily. 90 tablet 1   cyclobenzaprine (FLEXERIL) 5 MG tablet Take 1 tablet (5 mg total) by mouth 2 (two) times daily as needed for muscle spasms. 30 tablet 0   diclofenac Sodium (VOLTAREN) 1 % GEL APPLY 2 GRAMS TOPICALLY DAILY AS NEEDED FOR PAIN 100 g 6   ezetimibe (ZETIA) 10 MG tablet TAKE 1 TABLET(10 MG) BY MOUTH DAILY 90 tablet 1   glucose blood (ACCU-CHEK GUIDE) test strip Check blood sugar once daily. 100 each 2   lisinopril (ZESTRIL) 5 MG tablet Take 1 tablet (5 mg total) by mouth daily. 90 tablet 3   meloxicam (MOBIC) 15 MG tablet Take 1 tablet (15 mg total) by mouth daily. 90 tablet 0   metFORMIN (GLUCOPHAGE) 500 MG tablet Take 1 tablet (500 mg total) by mouth 2 (two) times daily with a meal. 180 tablet 1   metoprolol succinate (TOPROL-XL) 100 MG 24 hr tablet Take  1 tablet (100 mg total) by mouth daily. Take with or immediately following a meal. 90 tablet 3   Omega-3 Fatty Acids (FISH OIL) 1000 MG CAPS Take 1 capsule (1,000 mg total) by mouth daily. 90 capsule 3   pantoprazole (PROTONIX) 40 MG tablet Take 1 tablet (40 mg total) by mouth daily. 90 tablet 3   spironolactone (ALDACTONE) 50 MG tablet Take 1 tablet (50 mg total) by mouth 2 (two) times daily. 180 tablet 3   zolpidem (AMBIEN) 10 MG tablet TAKE 1 TABLET AT BEDTIME 30 tablet 0   No facility-administered medications prior to visit.    Review of Systems  Review of Systems  Constitutional: Negative.   Respiratory: Negative.    Psychiatric/Behavioral:  Negative for  sleep disturbance.    Physical Exam  BP 123/68   Pulse 66   Ht 6\' 4"  (1.93 m)   Wt 257 lb 6.4 oz (116.8 kg)   BMI 31.33 kg/m  Physical Exam Constitutional:      Appearance: Normal appearance.  Cardiovascular:     Rate and Rhythm: Normal rate and regular rhythm.  Pulmonary:     Effort: Pulmonary effort is normal.     Breath sounds: Normal breath sounds.  Skin:    General: Skin is warm and dry.  Neurological:     General: No focal deficit present.     Mental Status: He is alert and oriented to person, place, and time. Mental status is at baseline.  Psychiatric:        Mood and Affect: Mood normal.        Behavior: Behavior normal.        Thought Content: Thought content normal.        Judgment: Judgment normal.      Lab Results:  CBC    Component Value Date/Time   WBC 8.3 07/25/2023 1340   WBC 12.5 (H) 07/16/2022 2235   RBC 4.49 07/25/2023 1340   RBC 4.53 07/16/2022 2235   HGB 13.9 07/25/2023 1340   HCT 40.9 07/25/2023 1340   PLT 289 07/25/2023 1340   MCV 91 07/25/2023 1340   MCH 31.0 07/25/2023 1340   MCH 30.2 07/16/2022 2235   MCHC 34.0 07/25/2023 1340   MCHC 33.9 07/16/2022 2235   RDW 12.9 07/25/2023 1340   LYMPHSABS 2.9 07/25/2023 1340   MONOABS 0.7 07/16/2022 2235   EOSABS 0.3 07/25/2023 1340   BASOSABS 0.0 07/25/2023 1340    BMET    Component Value Date/Time   NA 138 07/25/2023 1340   K 5.0 07/25/2023 1340   CL 101 07/25/2023 1340   CO2 22 07/25/2023 1340   GLUCOSE 103 (H) 07/25/2023 1340   GLUCOSE 233 (H) 07/16/2022 2235   BUN 17 07/25/2023 1340   CREATININE 0.96 07/25/2023 1340   CALCIUM 9.7 07/25/2023 1340   GFRNONAA >60 07/16/2022 2235    BNP No results found for: "BNP"  ProBNP No results found for: "PROBNP"  Imaging: No results found.   Assessment & Plan:   OSA on CPAP - Mild OSA>> AHI 13/hour. Patient is 100% compliant with CPAP use >4 hours last 30 days. Sleep apnea is well-controlled on current pressure setting 5-10cm h20;  Residual AHI 3.2/hour. No changes needed today. Advised patient continue wear CPAP nightly 4 to 6 hours or longer and work on weight loss. Renew CPAP supplies with [facility]  Follow-up 1 year with [name], NP in [place], NP 10/04/2023

## 2023-11-02 ENCOUNTER — Other Ambulatory Visit: Payer: Self-pay | Admitting: Internal Medicine

## 2023-11-02 DIAGNOSIS — G4700 Insomnia, unspecified: Secondary | ICD-10-CM

## 2023-11-06 ENCOUNTER — Other Ambulatory Visit: Payer: Self-pay | Admitting: Internal Medicine

## 2023-12-01 ENCOUNTER — Other Ambulatory Visit: Payer: Self-pay | Admitting: Internal Medicine

## 2023-12-01 DIAGNOSIS — G4700 Insomnia, unspecified: Secondary | ICD-10-CM

## 2024-01-03 ENCOUNTER — Other Ambulatory Visit: Payer: Self-pay | Admitting: Internal Medicine

## 2024-01-03 DIAGNOSIS — G4700 Insomnia, unspecified: Secondary | ICD-10-CM

## 2024-01-16 ENCOUNTER — Encounter: Payer: Medicare Other | Attending: Internal Medicine | Admitting: Nutrition

## 2024-01-16 VITALS — Ht 74.0 in | Wt 272.0 lb

## 2024-01-16 DIAGNOSIS — E782 Mixed hyperlipidemia: Secondary | ICD-10-CM | POA: Diagnosis not present

## 2024-01-16 DIAGNOSIS — E66812 Obesity, class 2: Secondary | ICD-10-CM | POA: Insufficient documentation

## 2024-01-16 DIAGNOSIS — Z6834 Body mass index (BMI) 34.0-34.9, adult: Secondary | ICD-10-CM | POA: Diagnosis not present

## 2024-01-16 DIAGNOSIS — I2510 Atherosclerotic heart disease of native coronary artery without angina pectoris: Secondary | ICD-10-CM | POA: Insufficient documentation

## 2024-01-16 DIAGNOSIS — I10 Essential (primary) hypertension: Secondary | ICD-10-CM | POA: Diagnosis not present

## 2024-01-16 NOTE — Progress Notes (Unsigned)
Medical Nutrition Therapy  Appointment Start time:  [phone]  Appointment End time:  1015  Primary concerns today: Dm Type 2  Referral diagnosis: E11.8 Preferred learning style: Read  Learning readiness: Ready    NUTRITION ASSESSMENT FOllow up   Hasn't been as active as he usually is due to winter weather. NO recent A1C. Still working on eating more fruits and vegetables. Complains of generalized neuropathy. Has to care for his wife who has scoliosis. Eats meals at home. Weight has creaped back up. Metformin 500 mg BID/  Wt Readings from Last 3 Encounters:  01/16/24 272 lb (123.4 kg)  10/04/23 257 lb 6.4 oz (116.8 kg)  09/26/23 261 lb (118.4 kg)   Ht Readings from Last 3 Encounters:  01/16/24 6\' 2"  (1.88 m)  10/04/23 6\' 4"  (1.93 m)  09/26/23 6\' 4"  (1.93 m)   Body mass index is 34.92 kg/m. @BMIFA @ Facility age limit for growth %iles is 20 years. Facility age limit for growth %iles is 20 years.  Lab Results  Component Value Date   HGBA1C 6.6 (H) 07/25/2023      Latest Ref Rng & Units 07/25/2023    1:40 PM 08/05/2022    8:58 AM 07/16/2022   10:35 PM  CMP  Glucose 70 - 99 mg/dL 657  846  962   BUN 8 - 27 mg/dL 17  23  25    Creatinine 0.76 - 1.27 mg/dL 9.52  8.41  3.24   Sodium 134 - 144 mmol/L 138  136  137   Potassium 3.5 - 5.2 mmol/L 5.0  5.1  4.1   Chloride 96 - 106 mmol/L 101  97  103   CO2 20 - 29 mmol/L 22  23  25    Calcium 8.6 - 10.2 mg/dL 9.7  40.1  9.5   Total Protein 6.0 - 8.5 g/dL 6.8   7.8   Total Bilirubin 0.0 - 1.2 mg/dL 0.4   0.5   Alkaline Phos 44 - 121 IU/L 53   49   AST 0 - 40 IU/L 18   19   ALT 0 - 44 IU/L 21   26    Lipid Panel     Component Value Date/Time   CHOL 123 07/25/2023 1340   TRIG 140 07/25/2023 1340   HDL 51 07/25/2023 1340   CHOLHDL 2.4 07/25/2023 1340   LDLCALC 48 07/25/2023 1340   LABVLDL 24 07/25/2023 1340    Anthropometrics  Wt Readings from Last 3 Encounters:  10/04/23 257 lb 6.4 oz (116.8 kg)  09/26/23 261 lb (118.4 kg)   07/25/23 260 lb 12.8 oz (118.3 kg)   Ht Readings from Last 3 Encounters:  10/04/23 6\' 4"  (1.93 m)  09/26/23 6\' 4"  (1.93 m)  07/25/23 6\' 2"  (1.88 m)   There is no height or weight on file to calculate BMI. @BMIFA @ Facility age limit for growth %iles is 20 years. Facility age limit for growth %iles is 20 years.   Clinical Medical Hx: See Chart Medications: Metformin 500 mg BID. See chart Labs: A1C 6.9% Notable Signs/Symptoms: none  Lifestyle & Dietary Hx Married. Retired.  Estimated daily fluid: 40 oz Supplements: Vit C, Sleep: Takes ambien Stress / self-care: none Current average weekly physical activity: ADL  24-Hr Dietary Recall Eating more whole plant based foods Cut out processed meats and processed foods  Estimated Energy Needs Calories: 1600 Carbohydrate: 180g Protein: 120g Fat: 44g   NUTRITION DIAGNOSIS  NB-1.1 Food and nutrition-related knowledge deficit As related to Diabetes  Type 2.  As evidenced by A1C 6.9%.   NUTRITION INTERVENTION  Nutrition education (E-1) on the following topics:  Nutrition and Diabetes education provided on My Plate, CHO counting, meal planning, portion sizes, timing of meals, avoiding snacks between meals unless having a low blood sugar, target ranges for A1C and blood sugars, signs/symptoms and treatment of hyper/hypoglycemia, monitoring blood sugars, taking medications as prescribed, benefits of exercising 30 minutes per day and prevention of complications of DM.  Lifestyle Medicine  - Whole Food, Plant Predominant Nutrition is highly recommended: Eat Plenty of vegetables, Mushrooms, fruits, Legumes, Whole Grains, Nuts, seeds in lieu of processed meats, processed snacks/pastries red meat, poultry, eggs.    -It is better to avoid simple carbohydrates including: Cakes, Sweet Desserts, Ice Cream, Soda (diet and regular), Sweet Tea, Candies, Chips, Cookies, Store Bought Juices, Alcohol in Excess of  1-2 drinks a day, Lemonade,   Artificial Sweeteners, Doughnuts, Coffee Creamers, "Sugar-free" Products, etc, etc.  This is not a complete list.....  Exercise: If you are able: 30 -60 minutes a day ,4 days a week, or 150 minutes a week.  The longer the better.  Combine stretch, strength, and aerobic activities.  If you were told in the past that you have high risk for cardiovascular diseases, you may seek evaluation by your heart doctor prior to initiating moderate to intense exercise programs.   Handouts Provided Include  Know your numbers Lifestyle Medicine  Learning Style & Readiness for Change Teaching method utilized: Visual & Auditory  Demonstrated degree of understanding via: Teach Back  Barriers to learning/adherence to lifestyle change: none  Goals Established by Pt Keep working on eating more whole plant based foods Increase exercise when able. Watch portions and snacks. Keep drinking water Get weight back down 10 lbs by next visit. Get A1C to 6% or less.  EVALUATION Dietary intake, weekly physical activity, and weight in 4 month.  Recommend to follow up on lipids and see if any other medications for HTN and Hyperlipidemia can be reduced with the lifestyle changes he has made.  Next Steps  Keep up the great job!!

## 2024-01-17 ENCOUNTER — Encounter: Payer: Self-pay | Admitting: Nutrition

## 2024-01-17 NOTE — Patient Instructions (Signed)
Keep working on eating more whole plant based foods Increase exercise when able. Watch portions and snacks. Keep drinking water Get weight back down 10 lbs by next visit. Get A1C to 6% or less.

## 2024-01-25 ENCOUNTER — Ambulatory Visit: Payer: Medicare Other | Admitting: Internal Medicine

## 2024-01-25 ENCOUNTER — Encounter: Payer: Self-pay | Admitting: Internal Medicine

## 2024-01-25 VITALS — BP 131/65 | HR 77 | Ht 74.0 in | Wt 273.0 lb

## 2024-01-25 DIAGNOSIS — I10 Essential (primary) hypertension: Secondary | ICD-10-CM

## 2024-01-25 DIAGNOSIS — E118 Type 2 diabetes mellitus with unspecified complications: Secondary | ICD-10-CM

## 2024-01-25 DIAGNOSIS — E782 Mixed hyperlipidemia: Secondary | ICD-10-CM

## 2024-01-25 DIAGNOSIS — I714 Abdominal aortic aneurysm, without rupture, unspecified: Secondary | ICD-10-CM

## 2024-01-25 DIAGNOSIS — Z7984 Long term (current) use of oral hypoglycemic drugs: Secondary | ICD-10-CM

## 2024-01-25 DIAGNOSIS — Z23 Encounter for immunization: Secondary | ICD-10-CM

## 2024-01-25 DIAGNOSIS — R200 Anesthesia of skin: Secondary | ICD-10-CM | POA: Insufficient documentation

## 2024-01-25 DIAGNOSIS — I739 Peripheral vascular disease, unspecified: Secondary | ICD-10-CM

## 2024-01-25 DIAGNOSIS — G4700 Insomnia, unspecified: Secondary | ICD-10-CM

## 2024-01-25 LAB — BAYER DCA HB A1C WAIVED: HB A1C (BAYER DCA - WAIVED): 6.6 % — ABNORMAL HIGH (ref 4.8–5.6)

## 2024-01-25 MED ORDER — AMLODIPINE BESYLATE 10 MG PO TABS: 10.0000 mg | ORAL_TABLET | Freq: Every day | ORAL | 1 refills | Status: DC

## 2024-01-25 MED ORDER — SPIRONOLACTONE 50 MG PO TABS: 50.0000 mg | ORAL_TABLET | Freq: Two times a day (BID) | ORAL | 3 refills | Status: DC

## 2024-01-25 MED ORDER — CLOPIDOGREL BISULFATE 75 MG PO TABS: 75.0000 mg | ORAL_TABLET | Freq: Every day | ORAL | 1 refills | Status: DC

## 2024-01-25 MED ORDER — EZETIMIBE 10 MG PO TABS
10.0000 mg | ORAL_TABLET | Freq: Every day | ORAL | 1 refills | Status: DC
Start: 2024-01-25 — End: 2024-07-26

## 2024-01-25 MED ORDER — GABAPENTIN 300 MG PO CAPS
300.0000 mg | ORAL_CAPSULE | Freq: Every day | ORAL | 3 refills | Status: DC | PRN
Start: 2024-01-25 — End: 2024-07-08

## 2024-01-25 MED ORDER — LISINOPRIL 5 MG PO TABS
5.0000 mg | ORAL_TABLET | Freq: Every day | ORAL | 3 refills | Status: DC
Start: 2024-01-25 — End: 2024-10-10

## 2024-01-25 MED ORDER — PANTOPRAZOLE SODIUM 40 MG PO TBEC: 40.0000 mg | DELAYED_RELEASE_TABLET | Freq: Every day | ORAL | 3 refills | Status: DC

## 2024-01-25 MED ORDER — METFORMIN HCL 500 MG PO TABS
500.0000 mg | ORAL_TABLET | Freq: Two times a day (BID) | ORAL | 1 refills | Status: DC
Start: 2024-01-25 — End: 2024-07-26

## 2024-01-25 MED ORDER — ATORVASTATIN CALCIUM 80 MG PO TABS: 80.0000 mg | ORAL_TABLET | Freq: Every day | ORAL | 3 refills | Status: DC

## 2024-01-25 MED ORDER — ZOLPIDEM TARTRATE 10 MG PO TABS
10.0000 mg | ORAL_TABLET | Freq: Every day | ORAL | 0 refills | Status: DC
Start: 2024-01-25 — End: 2024-02-20

## 2024-01-25 MED ORDER — METOPROLOL SUCCINATE ER 100 MG PO TB24: 100.0000 mg | ORAL_TABLET | Freq: Every day | ORAL | 3 refills | Status: DC

## 2024-01-25 NOTE — Assessment & Plan Note (Signed)
 His acute concern today is numbness/tingling in both hands that radiates proximally to the elbows.  Symptoms are worse overnight and in the morning.  No significant strength or sensory deficit appreciated on exam today.  He is concerned for neuropathy.  Question carpal tunnel syndrome as well.  Positive Phalen's noted in the right hand on exam today.  He is not interested in pursuing further diagnostic workup at this time. Will trial gabapentin 300 mg daily as needed for symptom relief.

## 2024-01-25 NOTE — Assessment & Plan Note (Signed)
 Remains adequately controlled on current antihypertensive regimen.  No medication changes are indicated today.

## 2024-01-25 NOTE — Assessment & Plan Note (Signed)
 Influenza vaccine administered today.

## 2024-01-25 NOTE — Patient Instructions (Signed)
 It was a pleasure to see you today.  Thank you for giving [place] the opportunity to be involved in your care.  Below is a brief recap of your visit and next steps.  We will plan to see you again in 6 months.  Summary Try gabapentin for relief of arm numbness / tingling Check A1c Refills provided Follow up in 6 months

## 2024-01-25 NOTE — Progress Notes (Signed)
 Established Patient Office Visit  Subjective   Patient ID: SAIGE BUSBY, male    DOB: Apr 21, 1956  Age: 68 y.o. MRN: [id number]  Chief Complaint  Patient presents with   Care Management    Six month follow up    Shoulder Pain    Shoulder and arm pain on both sides    hernia     Hernia problem    Mr. Basnett returns to care today for routine follow-up.  He was last evaluated by me in August 2024.  No medication changes were made at that time and 72-month follow-up was arranged.  In the interim, he was seen by pulmonology for annual follow-up in October.  There have otherwise been no acute interval events. Mr. Pottenger reports feeling fairly well today.  He endorses recent bilateral hand and arm numbness/tingling.  Symptoms are worse overnight and early morning.  He is concerned for neuropathy.  He does not have any additional concerns to discuss.  Past Medical History:  Diagnosis Date   Aortic disorder (HCC)    Arthritis    Cervical radiculitis    Depression    Diabetes mellitus without complication (HCC)    Elevated glucose    GERD (gastroesophageal reflux disease)    Hypertension    Insomnia    Obstructive sleep apnea    Peripheral arterial disease (HCC)    Saccular aneurysm    Tinnitus of both ears    Past Surgical History:  Procedure Laterality Date   BACK SURGERY     STENT PLACEMENT ILIAC ([facility] HX) Bilateral    placed aproximately 17 years   Social History   Tobacco Use   Smoking status: Former    Current packs/day: 0.00    Types: Cigarettes    Quit date: 06/14/2007    Years since quitting: 16.6   Smokeless tobacco: Never  Vaping Use   Vaping status: Never Used  Substance Use Topics   Alcohol use: Not Currently    Comment: Occasional   Drug use: Not Currently   Family History  Problem Relation Age of Onset   Heart disease Father    Hypertension Father    Hyperlipidemia Father    Kidney disease Father    Allergies  Allergen Reactions   Hctz  [Hydrochlorothiazide] Other (See Comments)    Dizzy   Review of Systems  Constitutional:  Negative for chills and fever.  HENT:  Negative for sore throat.   Respiratory:  Negative for cough and shortness of breath.   Cardiovascular:  Negative for chest pain, palpitations and leg swelling.  Gastrointestinal:  Negative for abdominal pain, blood in stool, constipation, diarrhea, nausea and vomiting.  Genitourinary:  Negative for dysuria and hematuria.  Musculoskeletal:  Negative for myalgias.  Skin:  Negative for itching and rash.  Neurological:  Positive for tingling (Upper extremities bilaterally). Negative for dizziness and headaches.  Psychiatric/Behavioral:  Negative for depression and suicidal ideas.       Objective:     BP 131/65 (BP Location: Left Arm, Patient Position: Sitting, Cuff Size: Large)   Pulse 77   Ht 6\' 2"  (1.88 m)   Wt 273 lb (123.8 kg)   SpO2 96%   BMI 35.05 kg/m  BP Readings from Last 3 Encounters:  01/25/24 131/65  10/04/23 123/68  07/25/23 111/61   Physical Exam Vitals reviewed.  Constitutional:      General: He is not in acute distress.    Appearance: Normal appearance. He is obese. He is not  ill-appearing.  HENT:     Head: Normocephalic and atraumatic.     Right Ear: External ear normal.     Left Ear: External ear normal.     Nose: Nose normal. No congestion or rhinorrhea.     Mouth/Throat:     Mouth: Mucous membranes are moist.     Pharynx: Oropharynx is clear.  Eyes:     General: No scleral icterus.    Extraocular Movements: Extraocular movements intact.     Conjunctiva/sclera: Conjunctivae normal.     Pupils: Pupils are equal, round, and reactive to light.  Cardiovascular:     Rate and Rhythm: Normal rate and regular rhythm.     Pulses: Normal pulses.     Heart sounds: Normal heart sounds. No murmur heard. Pulmonary:     Effort: Pulmonary effort is normal.     Breath sounds: Normal breath sounds. No wheezing, rhonchi or rales.   Abdominal:     General: Abdomen is flat. Bowel sounds are normal. There is no distension.     Palpations: Abdomen is soft.     Tenderness: There is no abdominal tenderness.     Hernia: A hernia (Reducible umbilical hernia) is present.  Musculoskeletal:        General: No swelling or deformity. Normal range of motion.     Cervical back: Normal range of motion.  Skin:    General: Skin is warm and dry.     Capillary Refill: Capillary refill takes less than 2 seconds.  Neurological:     General: No focal deficit present.     Mental Status: He is alert and oriented to person, place, and time.     Motor: No weakness.  Psychiatric:        Mood and Affect: Mood normal.        Behavior: Behavior normal.        Thought Content: Thought content normal.   Last CBC Lab Results  Component Value Date   WBC 8.3 07/25/2023   HGB 13.9 07/25/2023   HCT 40.9 07/25/2023   MCV 91 07/25/2023   MCH 31.0 07/25/2023   RDW 12.9 07/25/2023   PLT 289 07/25/2023   Last metabolic panel Lab Results  Component Value Date   GLUCOSE 103 (H) 07/25/2023   NA 138 07/25/2023   K 5.0 07/25/2023   CL 101 07/25/2023   CO2 22 07/25/2023   BUN 17 07/25/2023   CREATININE 0.96 07/25/2023   EGFR 87 07/25/2023   CALCIUM 9.7 07/25/2023   PROT 6.8 07/25/2023   ALBUMIN 4.8 07/25/2023   LABGLOB 2.0 07/25/2023   AGRATIO 1.8 04/27/2022   BILITOT 0.4 07/25/2023   ALKPHOS 53 07/25/2023   AST 18 07/25/2023   ALT 21 07/25/2023   ANIONGAP 9 07/16/2022   Last lipids Lab Results  Component Value Date   CHOL 123 07/25/2023   HDL 51 07/25/2023   LDLCALC 48 07/25/2023   TRIG 140 07/25/2023   CHOLHDL 2.4 07/25/2023   Last hemoglobin A1c Lab Results  Component Value Date   HGBA1C 6.6 (H) 01/25/2024   Last thyroid functions Lab Results  Component Value Date   TSH 0.759 07/25/2023   Last vitamin D Lab Results  Component Value Date   VD25OH 37.2 07/25/2023   Last vitamin B12 and Folate Lab Results   Component Value Date   VITAMINB12 643 07/25/2023   FOLATE 17.5 07/25/2023     Assessment & Plan:   Problem List Items Addressed This Visit  Hypertension (Chronic)   Remains adequately controlled on current antihypertensive regimen.  No medication changes are indicated today.      PAD (peripheral artery disease) (HCC) (Chronic)   Remains on DAPT (ASA/Plavix) and atorvastatin.  Refills provided today.      AAA (abdominal aortic aneurysm) without rupture (HCC) (Chronic)   Previously followed by vascular surgery.  Vascular AAA ultrasound last updated in May 2023, which is when he was last seen by vascular surgery, measuring 3.01 cm in greatest diameter.  Repeat ultrasound recommended for 2 years (May 2025).  This has been ordered today with anticipated completion in May.      Controlled diabetes mellitus type 2 with complications (HCC) - Primary   POC A1c today is 6.6.  He remains on metformin 500 mg twice daily and has made significant dietary changes in an effort to improve diabetes control.  No medication changes are indicated today.      Hyperlipidemia (Chronic)   Lipid panel updated in August 2024 reflects excellent control on atorvastatin, Zetia, and fish oil supplementation.      Need for immunization against influenza   Influenza vaccine administered today      Bilateral hand numbness   His acute concern today is numbness/tingling in both hands that radiates proximally to the elbows.  Symptoms are worse overnight and in the morning.  No significant strength or sensory deficit appreciated on exam today.  He is concerned for neuropathy.  Question carpal tunnel syndrome as well.  Positive Phalen's noted in the right hand on exam today.  He is not interested in pursuing further diagnostic workup at this time. Will trial gabapentin 300 mg daily as needed for symptom relief.      Return in about 6 months (around 07/24/2024).   Billie Lade, MD

## 2024-01-25 NOTE — Assessment & Plan Note (Signed)
 Lipid panel updated in August 2024 reflects excellent control on atorvastatin, Zetia, and fish oil supplementation.

## 2024-01-25 NOTE — Assessment & Plan Note (Signed)
 Previously followed by vascular surgery.  Vascular AAA ultrasound last updated in May 2023, which is when he was last seen by vascular surgery, measuring 3.01 cm in greatest diameter.  Repeat ultrasound recommended for 2 years (May 2025).  This has been ordered today with anticipated completion in May.

## 2024-01-25 NOTE — Assessment & Plan Note (Signed)
 POC A1c today is 6.6.  He remains on metformin 500 mg twice daily and has made significant dietary changes in an effort to improve diabetes control.  No medication changes are indicated today.

## 2024-01-25 NOTE — Assessment & Plan Note (Signed)
 Remains on DAPT (ASA/Plavix) and atorvastatin.  Refills provided today.

## 2024-02-19 ENCOUNTER — Other Ambulatory Visit: Payer: Self-pay | Admitting: Internal Medicine

## 2024-02-19 DIAGNOSIS — G4700 Insomnia, unspecified: Secondary | ICD-10-CM

## 2024-03-01 ENCOUNTER — Other Ambulatory Visit: Payer: Self-pay | Admitting: Internal Medicine

## 2024-03-07 DIAGNOSIS — G4733 Obstructive sleep apnea (adult) (pediatric): Secondary | ICD-10-CM | POA: Diagnosis not present

## 2024-03-20 ENCOUNTER — Other Ambulatory Visit: Payer: Self-pay | Admitting: Internal Medicine

## 2024-03-20 DIAGNOSIS — G4700 Insomnia, unspecified: Secondary | ICD-10-CM

## 2024-04-04 ENCOUNTER — Telehealth: Payer: Self-pay

## 2024-04-04 NOTE — Telephone Encounter (Signed)
 Checking percert on the following patient for   AAA - 04/08/2024  [facility] )

## 2024-04-08 ENCOUNTER — Ambulatory Visit: Payer: Medicare Other | Attending: Internal Medicine

## 2024-04-08 DIAGNOSIS — I714 Abdominal aortic aneurysm, without rupture, unspecified: Secondary | ICD-10-CM

## 2024-04-11 ENCOUNTER — Encounter: Payer: Self-pay | Admitting: Internal Medicine

## 2024-04-22 ENCOUNTER — Other Ambulatory Visit: Payer: Self-pay | Admitting: Internal Medicine

## 2024-04-22 DIAGNOSIS — G4700 Insomnia, unspecified: Secondary | ICD-10-CM

## 2024-05-20 ENCOUNTER — Other Ambulatory Visit: Payer: Self-pay | Admitting: Internal Medicine

## 2024-05-20 DIAGNOSIS — G4700 Insomnia, unspecified: Secondary | ICD-10-CM

## 2024-06-18 ENCOUNTER — Ambulatory Visit: Payer: Medicare Other

## 2024-06-18 VITALS — BP 126/64 | HR 72 | Resp 14 | Ht 74.0 in | Wt 273.0 lb

## 2024-06-18 DIAGNOSIS — Z0000 Encounter for general adult medical examination without abnormal findings: Secondary | ICD-10-CM

## 2024-06-18 DIAGNOSIS — E118 Type 2 diabetes mellitus with unspecified complications: Secondary | ICD-10-CM | POA: Diagnosis not present

## 2024-06-18 DIAGNOSIS — Z1159 Encounter for screening for other viral diseases: Secondary | ICD-10-CM

## 2024-06-18 DIAGNOSIS — Z7984 Long term (current) use of oral hypoglycemic drugs: Secondary | ICD-10-CM | POA: Diagnosis not present

## 2024-06-18 DIAGNOSIS — Z0001 Encounter for general adult medical examination with abnormal findings: Secondary | ICD-10-CM

## 2024-06-18 LAB — GLUCOSE, POCT (MANUAL RESULT ENTRY): POC Glucose: 125 mg/dL — AB (ref 70–99)

## 2024-06-18 NOTE — Progress Notes (Signed)
 Patient has a transfer of care appointment with you on July 26, 2024. He needs the following health maintenance items addressed to close those gaps. Hep C screening, eGFR, Urine acr, foot exam, A1C.  Subjective:   Thomas Lindsey is a 68 y.o. who presents for a Medicare Wellness preventive visit.  As a reminder, Annual Wellness Visits don't include a physical exam, and some assessments may be limited, especially if this visit is performed virtually. We may recommend an in-person follow-up visit with your provider if needed.  Visit Complete: In person  Persons Participating in Visit: Patient.  AWV Questionnaire: Yes: Patient Medicare AWV questionnaire was completed by the patient on 06/14/2024; I have confirmed that all information answered by patient is correct and no changes since this date.  Cardiac Risk Factors include: advanced age (>91men, >68 women);diabetes mellitus;dyslipidemia;hypertension;obesity (BMI >30kg/m2);Other (see comment), Risk factor comments: Sleep Apnea on CPAP     Objective:    Today's Vitals   06/18/24 0855  BP: 126/64  Pulse: 72  Resp: 14  SpO2: 95%  Weight: 273 lb 0.6 oz (123.9 kg)  Height: 6' 2 (1.88 m)   Body mass index is 35.06 kg/m.     06/18/2024    1:07 PM 07/16/2023    7:28 AM 06/14/2023    9:01 AM 05/30/2022    9:13 AM 07/29/2021   10:27 AM  Advanced Directives  Does Patient Have a Medical Advance Directive? Yes No No Yes Yes  Type of Estate agent of [place];Living will   Healthcare Power of [place];Living will Healthcare Power of [place];Living will  Does patient want to make changes to medical advance directive?     No - Patient declined  Copy of Healthcare Power of Attorney in Chart? No - copy requested   No - copy requested No - copy requested  Would patient like information on creating a medical advance directive?   No - Patient declined  No - Patient declined    Current Medications (verified) Outpatient  Encounter Medications as of 06/18/2024  Medication Sig   amLODipine  (NORVASC ) 10 MG tablet Take 1 tablet (10 mg total) by mouth daily.   ascorbic acid (VITAMIN C ) 500 MG tablet Take 1 tablet (500 mg total) by mouth daily.   atorvastatin  (LIPITOR) 80 MG tablet Take 1 tablet (80 mg total) by mouth daily.   clopidogrel  (PLAVIX ) 75 MG tablet Take 1 tablet (75 mg total) by mouth daily.   cyclobenzaprine  (FLEXERIL ) 5 MG tablet TAKE 1 TABLET TWICE A DAY AS NEEDED FOR MUSCLE SPASMS   diclofenac  Sodium (VOLTAREN ) 1 % GEL APPLY 2 GRAMS TOPICALLY DAILY AS NEEDED FOR PAIN   ezetimibe  (ZETIA ) 10 MG tablet Take 1 tablet (10 mg total) by mouth daily.   gabapentin  (NEURONTIN ) 300 MG capsule Take 1 capsule (300 mg total) by mouth daily as needed.   glucose blood (ACCU-CHEK GUIDE) test strip Check blood sugar once daily.   lisinopril  (ZESTRIL ) 5 MG tablet Take 1 tablet (5 mg total) by mouth daily.   metFORMIN  (GLUCOPHAGE ) 500 MG tablet Take 1 tablet (500 mg total) by mouth 2 (two) times daily with a meal.   metoprolol  succinate (TOPROL -XL) 100 MG 24 hr tablet Take 1 tablet (100 mg total) by mouth daily. Take with or immediately following a meal.   Omega-3 Fatty Acids (FISH OIL ) 1000 MG CAPS Take 1 capsule (1,000 mg total) by mouth daily.   pantoprazole  (PROTONIX ) 40 MG tablet Take 1 tablet (40 mg total) by mouth  daily.   spironolactone  (ALDACTONE ) 50 MG tablet Take 1 tablet (50 mg total) by mouth 2 (two) times daily.   zolpidem  (AMBIEN ) 10 MG tablet TAKE 1 TABLET AT BEDTIME   No facility-administered encounter medications on file as of 06/18/2024.    Allergies (verified) Hctz [hydrochlorothiazide]   History: Past Medical History:  Diagnosis Date   AAA (abdominal aortic aneurysm) (HCC) 2016   Aortic disorder (HCC)    Arthritis    Cervical radiculitis    Depression    Diabetes mellitus without complication (HCC)    Elevated glucose    GERD (gastroesophageal reflux disease)    Hypertension    Insomnia     Obstructive sleep apnea    Peripheral arterial disease (HCC)    Saccular aneurysm    Sleep apnea 2019   Tinnitus of both ears    Past Surgical History:  Procedure Laterality Date   BACK SURGERY     SPINE SURGERY  2008   STENT PLACEMENT ILIAC ([facility] HX) Bilateral    placed aproximately 17 years   Family History  Problem Relation Age of Onset   Arthritis Mother    Varicose Veins Mother    Heart disease Father    Hypertension Father    Hyperlipidemia Father    Kidney disease Father    Social History   Socioeconomic History   Marital status: Married    Spouse name: Not on file   Number of children: 3   Years of education: Not on file   Highest education level: Not on file  Occupational History   Not on file  Tobacco Use   Smoking status: Former    Current packs/day: 0.00    Average packs/day: 1 pack/day for 15.0 years (15.0 ttl pk-yrs)    Types: Cigarettes, Cigars    Quit date: 06/14/2007    Years since quitting: 17.0   Smokeless tobacco: Never  Vaping Use   Vaping status: Never Used  Substance and Sexual Activity   Alcohol use: Never    Comment: Occasional   Drug use: Not Currently   Sexual activity: Yes    Birth control/protection: None  Other Topics Concern   Not on file  Social History Narrative   Lives with his wife    Social Drivers of Health   Financial Resource Strain: Low Risk  (06/14/2024)   Overall Financial Resource Strain (CARDIA)    Difficulty of Paying Living Expenses: Not hard at all  Food Insecurity: No Food Insecurity (06/14/2024)   Hunger Vital Sign    Worried About Running Out of Food in the Last Year: Never true    Ran Out of Food in the Last Year: Never true  Transportation Needs: No Transportation Needs (06/14/2024)   PRAPARE - Administrator, Civil Service (Medical): No    Lack of Transportation (Non-Medical): No  Physical Activity: Patient Declined (06/14/2024)   Exercise Vital Sign    Days of Exercise per Week: Patient  declined    Minutes of Exercise per Session: Patient declined  Stress: No Stress Concern Present (06/14/2024)   [employer] of Occupational Health - Occupational Stress Questionnaire    Feeling of Stress: Not at all  Social Connections: Unknown (06/14/2024)   Social Connection and Isolation Panel    Frequency of Communication with Friends and Family: Patient declined    Frequency of Social Gatherings with Friends and Family: Patient declined    Attends Religious Services: Patient declined    Active Member of [employer]  or Organizations: Patient declined    Attends Banker Meetings: Patient declined    Marital Status: Married    Tobacco Counseling Counseling given: Yes    Clinical Intake:  Pre-visit preparation completed: Yes  Pain : No/denies pain     BMI - recorded: 35.06 Nutritional Risks: None Diabetes: Yes CBG done?: Yes CBG resulted in Enter/ Edit results?: Yes Did pt. bring in CBG monitor from home?: No  Lab Results  Component Value Date   HGBA1C 6.6 (H) 01/25/2024   HGBA1C 6.6 (H) 07/25/2023   HGBA1C 6.9 (H) 01/20/2023     How often do you need to have someone help you when you read instructions, pamphlets, or other written materials from your doctor or pharmacy?: 1 - Never  Interpreter Needed?: No  Information entered by :: A Aadhira Heffernan CMA-AAMA   Activities of Daily Living     06/18/2024    9:16 AM  In your present state of health, do you have any difficulty performing the following activities:  Hearing? 0  Vision? 0  Difficulty concentrating or making decisions? 0  Walking or climbing stairs? 0  Dressing or bathing? 0  Doing errands, shopping? 0  Preparing Food and eating ? N  Using the Toilet? N  In the past six months, have you accidently leaked urine? N  Do you have problems with loss of bowel control? N  Managing your Medications? N  Managing your Finances? N  Housekeeping or managing your Housekeeping? N    Patient Care  Team: Bevely Doffing, FNP as PCP - General (Family Medicine) Mallipeddi, Diannah SQUIBB, MD as Consulting Physician (Cardiology) [facility], [facility] (Optometry) Crumpton, Ronal BIRCH, RD as Dietitian (Nutrition)  I have updated your Care Teams any recent Medical Services you may have received from other providers in the past year.     Assessment:   This is a routine wellness examination for [place].  Hearing/Vision screen Hearing Screening - Comments:: Patient denies any hearing difficulties.     Goals Addressed               This Visit's Progress     Patient Stated   On track     Patient states that his goal is to get up and get going everyday and finish his home projects.      Patient Stated (pt-stated)   On track     Lose a little more weight        Depression Screen     06/18/2024    1:08 PM 01/25/2024   10:01 AM 07/25/2023    1:03 PM 06/14/2023    8:59 AM 01/20/2023    1:10 PM 01/17/2023    2:55 PM 10/19/2022    1:26 PM  PHQ 2/9 Scores  PHQ - 2 Score  0 0 0 0 0 0  PHQ- 9 Score     0    Exception Documentation Patient refusal          Fall Risk     06/18/2024    1:07 PM 01/25/2024   10:01 AM 07/25/2023    1:02 PM 06/14/2023    9:01 AM 01/20/2023    1:10 PM  Fall Risk   Falls in the past year? 0 0 0 0 0  Number falls in past yr: 0 0 0 0 0  Injury with Fall? 0 0 0 0 0  Risk for fall due to : No Fall Risks No Fall Risks No Fall Risks  No Fall Risks No Fall Risks  Follow up Falls evaluation completed;Education provided;Falls prevention discussed Falls evaluation completed Falls evaluation completed Falls prevention discussed Falls evaluation completed    MEDICARE RISK AT HOME:  Medicare Risk at Home Any stairs in or around the home?: Yes If so, are there any without handrails?: No Home free of loose throw rugs in walkways, pet beds, electrical cords, etc?: Yes Adequate lighting in your home to reduce risk of falls?: Yes Life alert?: No Use of a cane,  walker or w/c?: No Grab bars in the bathroom?: Yes Shower chair or bench in shower?: Yes Elevated toilet seat or a handicapped toilet?: Yes  TIMED UP AND GO:  Was the test performed?  Yes  Length of time to ambulate 10 feet: 5 sec Gait steady and fast without use of assistive device  Cognitive Function: 6CIT completed        06/18/2024    1:08 PM 06/14/2023    9:01 AM 05/30/2022    9:18 AM  6CIT Screen  What Year? 0 points 0 points 0 points  What month? 0 points 0 points 0 points  What time? 0 points 0 points 0 points  Count back from 20 0 points 0 points 0 points  Months in reverse 0 points 0 points 0 points  Repeat phrase 0 points 0 points 0 points  Total Score 0 points 0 points 0 points    Immunizations Immunization History  Administered Date(s) Administered   Fluad Quad(high Dose 65+) 08/11/2022   Fluad Trivalent(High Dose 65+) 01/25/2024   Influenza-Unspecified 09/13/2021   Moderna Covid-19 Vaccine Bivalent Booster 41yrs & up 06/24/2021, 09/13/2021   Moderna SARS-COV2 Booster Vaccination 10/20/2020   PFIZER Comirnaty(Gray Top)Covid-19 Tri-Sucrose Vaccine 02/07/2020, 02/28/2020    Screening Tests Health Maintenance  Topic Date Due   OPHTHALMOLOGY EXAM  Never done   Hepatitis C Screening  Never done   DTaP/Tdap/Td (1 - Tdap) Never done   Pneumococcal Vaccine: 50+ Years (1 of 2 - PCV) Never done   Zoster Vaccines- Shingrix (1 of 2) Never done   COVID-19 Vaccine (5 - 2024-25 season) 08/06/2023   INFLUENZA VACCINE  07/05/2024   Diabetic kidney evaluation - eGFR measurement  07/24/2024   Diabetic kidney evaluation - Urine ACR  07/24/2024   FOOT EXAM  07/24/2024   HEMOGLOBIN A1C  07/24/2024   Medicare Annual Wellness (AWV)  06/18/2025   Colonoscopy  05/21/2027   Hepatitis B Vaccines  Aged Out   HPV VACCINES  Aged Out   Meningococcal B Vaccine  Aged Out    Health Maintenance  Health Maintenance Due  Topic Date Due   OPHTHALMOLOGY EXAM  Never done    Hepatitis C Screening  Never done   DTaP/Tdap/Td (1 - Tdap) Never done   Pneumococcal Vaccine: 50+ Years (1 of 2 - PCV) Never done   Zoster Vaccines- Shingrix (1 of 2) Never done   COVID-19 Vaccine (5 - 2024-25 season) 08/06/2023   Health Maintenance Items Addressed: Referral sent to Optometry/Ophthalmology, Diabetic Foot Exam recommended, Labs Ordered: eGFR, Urine ACR, A1C  Additional Screening:  Vision Screening: Recommended annual ophthalmology exams for early detection of glaucoma and other disorders of the eye. Would you like a referral to an eye doctor? Yes   Patient referred to Dr. Cleatus in [facility] for a diabetic eye exam Dental Screening: Recommended annual dental exams for proper oral hygiene  Community Resource Referral / Chronic Care Management: CRR required this visit?  No   CCM  required this visit?  No   Plan:Lab Orders         POCT Glucose (CBG)   Result 125 mg/dL documented in chart .labs    I have personally reviewed and noted the following in the patient's chart:   Medical and social history Use of alcohol, tobacco or illicit drugs  Current medications and supplements including opioid prescriptions. Patient is not currently taking opioid prescriptions. Functional ability and status Nutritional status Physical activity Advanced directives List of other physicians Hospitalizations, surgeries, and ER visits in previous 12 months Vitals Screenings to include cognitive, depression, and falls Referrals and appointments  In addition, I have reviewed and discussed with patient certain preventive protocols, quality metrics, and best practice recommendations. A written personalized care plan for preventive services as well as general preventive health recommendations were provided to patient.   Latoi Giraldo, CMA   06/18/2024   After Visit Summary: (MyChart) Due to this being a telephonic visit, the after visit summary with patients personalized plan was  offered to patient via MyChart   Notes: Please refer to note at the top of this progress note.

## 2024-06-18 NOTE — Patient Instructions (Addendum)
 Mr. Ogan ,  Thank you for taking time out of your busy schedule to complete your Annual Wellness Visit with me. I enjoyed our conversation and look forward to speaking with you again next year. I, as well as your care team,  appreciate your ongoing commitment to your health goals. Please review the following plan we discussed and let me know if I can assist you in the future.  I enjoyed our conversation and look forward to it again next year. Blessing for the upcoming year!!  -Bayley Yarborough  Your Game plan/ To Do List   Referrals Placed:  [facility].  You can call them to schedule your appointment Address: [address], [place], [place] [address] Phone: [phone]  Lab Orders         Hemoglobin A1c         CMP14+EGFR         Hepatitis C antibody         Microalbumin / creatinine urine ratio         POCT Glucose (CBG)    Have these labs completed at [facility]. You can them completed the same day you see Leita for your transfer of care appointment. Remember,         Follow up Visits:  Next Office Visit with your Primary Care Provider: July 26, 2024 at 8:20 am Medicare Wellness with Health Advisor (1 year): June 23, 2024 at 8:40 am in office    Clinician Recommendations:  Aim for 30 minutes of exercise or brisk walking, 6-8 glasses of water, and 5 servings of fruits and vegetables each day.   We will mail you an Advanced Directives packet as we discussed during your visit today. You do not have to have an attorney to complete this paperwork. Read over the packet, discuss it with family, and complete it. Choose who will be making decisions for you in the event you can no longer make them for yourself. Once completed have them notarized, and bring the packet back to the office. We will scan it and make sure it gets into your chart.   If you choose to have a DNR (Do Not Resuscitate Order) you must get this from your primary care doctor because  they have to sign it. You can get this in the office during an appointment.   THIS ORDER MUST BE VISIBLE AT ALL TIMES WITHIN YOUR HOME SUCH AS ON A REFRIGERATOR.   This is a list of the screening recommended for you and due dates:  Health Maintenance  Topic Date Due   Eye exam for diabetics  Never done   Hepatitis C Screening  Never done   DTaP/Tdap/Td vaccine (1 - Tdap) Never done   Pneumococcal Vaccine for age over 54 (1 of 2 - PCV) Never done   Zoster (Shingles) Vaccine (1 of 2) Never done   COVID-19 Vaccine (5 - 2024-25 season) 08/06/2023   Flu Shot  07/05/2024   Yearly kidney function blood test for diabetes  07/24/2024   Yearly kidney health urinalysis for diabetes  07/24/2024   Complete foot exam   07/24/2024   Hemoglobin A1C  07/24/2024   Medicare Annual Wellness Visit  06/18/2025   Colon Cancer Screening  05/21/2027   Hepatitis B Vaccine  Aged Out   HPV Vaccine  Aged Out   Meningitis B Vaccine  Aged Out    Advanced directives: (Copy Requested) Please bring a copy of your health care  power of attorney and living will to the office to be added to your chart at your convenience. You can mail to [facility] [address] [place], [place] [address] or email to [email or web address] Advance Care Planning is important because it:  [x]  Makes sure you receive the medical care that is consistent with your values, goals, and preferences  [x]  It provides guidance to your family and loved ones and reduces their decisional burden about whether or not they are making the right decisions based on your wishes.  Follow the link provided in your after visit summary or read over the paperwork we have mailed to you to help you started getting your Advance Directives in place. If you need assistance in completing these, please reach out to us  so that we can help you!  If you choose to send your directives in yourself, the information to do so is below:  Please email a  copy of your Advanced Healthcare Directives,such as your Healthcare Power of Attorney, Living Will or DNR status to the following secure email: [email or web address]

## 2024-06-22 ENCOUNTER — Other Ambulatory Visit: Payer: Self-pay | Admitting: Internal Medicine

## 2024-06-22 DIAGNOSIS — G4700 Insomnia, unspecified: Secondary | ICD-10-CM

## 2024-07-05 DIAGNOSIS — E119 Type 2 diabetes mellitus without complications: Secondary | ICD-10-CM | POA: Diagnosis not present

## 2024-07-08 ENCOUNTER — Encounter: Payer: Self-pay | Admitting: Orthopedic Surgery

## 2024-07-08 ENCOUNTER — Other Ambulatory Visit (INDEPENDENT_AMBULATORY_CARE_PROVIDER_SITE_OTHER)

## 2024-07-08 ENCOUNTER — Ambulatory Visit: Admitting: Orthopedic Surgery

## 2024-07-08 VITALS — BP 119/73 | HR 74 | Ht 74.0 in | Wt 272.0 lb

## 2024-07-08 DIAGNOSIS — M1711 Unilateral primary osteoarthritis, right knee: Secondary | ICD-10-CM

## 2024-07-08 DIAGNOSIS — M1712 Unilateral primary osteoarthritis, left knee: Secondary | ICD-10-CM

## 2024-07-08 DIAGNOSIS — M170 Bilateral primary osteoarthritis of knee: Secondary | ICD-10-CM | POA: Diagnosis not present

## 2024-07-08 MED ORDER — METHYLPREDNISOLONE ACETATE 40 MG/ML IJ SUSP
40.0000 mg | Freq: Once | INTRAMUSCULAR | Status: AC
  Administered 2024-07-08: 40 mg via INTRA_ARTICULAR

## 2024-07-08 NOTE — Patient Instructions (Signed)

## 2024-07-08 NOTE — Progress Notes (Signed)
   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 History of bilateral knee scopes Bilateral knee injections  Complains of pain swelling and at times popping both knees  He really gets symptomatic on uneven ground or outside  The right knee shows lateral compartment narrowing the left knee shows some medial compartment narrowing left knee is in varus the right knee looks to be in valgus  Patient has hypertension peripheral artery disease history of AAA without rupture diabetes coronary artery disease  BP 119/73   Pulse 74   Ht 6' 2 (1.88 m)   Wt 272 lb (123.4 kg)   BMI 34.92 kg/m    Right knee small effusion left knee no effusion Medial lateral joint line tenderness right and left knee Range of motion normal in both knees Strength and stability test normal bilaterally  No pulse was felt in either foot status post bilateral what appears to be femoral stents  DG Knee AP/LAT W/Sunrise Right Result Date: 07/08/2024 Right knee pain.  X-ray shows valgus alignment to the right knee lateral compartment narrowing.  No secondary bone changes. Patellofemoral disease none. Impression grade 2 arthritis right knee     Encounter Diagnoses  Name Primary?   Primary osteoarthritis of right knee Yes   Primary osteoarthritis of left knee      Procedure note for bilateral knee injections  Procedure note left knee injection verbal consent was obtained to inject left knee joint  Timeout was completed to confirm the site of injection  The medications used were 40 mg depomedrol and 3 cc of 1% lidocaine   Anesthesia was provided by ethyl chloride and the skin was prepped with alcohol.  After cleaning the skin with alcohol a 20-gauge needle was used to inject the left knee joint. There were no complications. A sterile bandage was applied.   Procedure note right knee  injection verbal consent was obtained to inject right knee joint  Timeout was completed to confirm the site of injection  The medications used were 40 mg depomedrol and 3 cc of 1% lidocaine   Anesthesia was provided by ethyl chloride and the skin was prepped with alcohol.  After cleaning the skin with alcohol a 20-gauge needle was used to inject the right knee joint. There were no complications. A sterile bandage was applied.    Return as needed.  Scheduled for HA injections next visit

## 2024-07-08 NOTE — Progress Notes (Signed)
 a   Intake history:  BP 119/73   Pulse 74   Ht 6' 2 (1.88 m)   Wt 272 lb (123.4 kg)   BMI 34.92 kg/m  Body mass index is 34.92 kg/m.    WHAT ARE WE SEEING YOU FOR TODAY?   bilateral knee(s)  How long has this bothered you? (DOI?DOS?WS?)  6 month(s) ago  Anticoag.  Yes  Diabetes Yes  Heart disease Yes  Hypertension Yes  SMOKING HX No  Kidney disease No  Any ALLERGIES _______________ Allergies  Allergen Reactions   Hctz [Hydrochlorothiazide] Other (See Comments)    Dizzy   _______________________________   Treatment:  Have you taken:  Tylenol  Yes  Advil No  Had PT Yes  Had injection Yes multiple injections both knees  Other  __________knee arthroscopy both knees no records _______________

## 2024-07-16 ENCOUNTER — Other Ambulatory Visit: Payer: Self-pay

## 2024-07-16 ENCOUNTER — Other Ambulatory Visit: Payer: Self-pay | Admitting: Internal Medicine

## 2024-07-16 ENCOUNTER — Encounter: Payer: Medicare Other | Admitting: Nutrition

## 2024-07-16 VITALS — Ht 74.0 in | Wt 273.0 lb

## 2024-07-16 DIAGNOSIS — E782 Mixed hyperlipidemia: Secondary | ICD-10-CM | POA: Diagnosis not present

## 2024-07-16 DIAGNOSIS — I10 Essential (primary) hypertension: Secondary | ICD-10-CM | POA: Insufficient documentation

## 2024-07-16 DIAGNOSIS — I2510 Atherosclerotic heart disease of native coronary artery without angina pectoris: Secondary | ICD-10-CM | POA: Diagnosis not present

## 2024-07-16 DIAGNOSIS — E66812 Obesity, class 2: Secondary | ICD-10-CM | POA: Insufficient documentation

## 2024-07-16 DIAGNOSIS — G4700 Insomnia, unspecified: Secondary | ICD-10-CM

## 2024-07-16 DIAGNOSIS — E118 Type 2 diabetes mellitus with unspecified complications: Secondary | ICD-10-CM | POA: Insufficient documentation

## 2024-07-16 NOTE — Progress Notes (Signed)
 Medical Nutrition Therapy  Appointment Start time:  [phone]  Appointment End time:  1015  Primary concerns today: Dm Type 2  Referral diagnosis: E11.8 Preferred learning style: Read  Learning readiness: Ready    NUTRITION ASSESSMENT FOllow up   'I have to take care of my wife who has scoliosis and other bone issues. I garden and I process what we grow. FBS 130-135 mg/dl Staying active in garden. He cooks all meals. Overweight BMI 35. Wt stable. Not able to exercise much due to caring for his wife. Still taking Metformin  500 mg BID. Hasn't had full follow up for lab work from provider yet. Will get A1c done next visit.  Wt Readings from Last 3 Encounters:  07/16/24 273 lb (123.8 kg)  07/08/24 272 lb (123.4 kg)  06/18/24 273 lb 0.6 oz (123.9 kg)   Ht Readings from Last 3 Encounters:  07/16/24 6' 2 (1.88 m)  07/08/24 6' 2 (1.88 m)  06/18/24 6' 2 (1.88 m)   Body mass index is 35.05 kg/m. @BMIFA @ Facility age limit for growth %iles is 20 years. Facility age limit for growth %iles is 20 years.   Lab Results  Component Value Date   HGBA1C 6.6 (H) 01/25/2024      Latest Ref Rng & Units 07/25/2023    1:40 PM 08/05/2022    8:58 AM 07/16/2022   10:35 PM  CMP  Glucose 70 - 99 mg/dL 896  863  766   BUN 8 - 27 mg/dL 17  23  25    Creatinine 0.76 - 1.27 mg/dL 9.03  8.96  9.03   Sodium 134 - 144 mmol/L 138  136  137   Potassium 3.5 - 5.2 mmol/L 5.0  5.1  4.1   Chloride 96 - 106 mmol/L 101  97  103   CO2 20 - 29 mmol/L 22  23  25    Calcium  8.6 - 10.2 mg/dL 9.7  89.8  9.5   Total Protein 6.0 - 8.5 g/dL 6.8   7.8   Total Bilirubin 0.0 - 1.2 mg/dL 0.4   0.5   Alkaline Phos 44 - 121 IU/L 53   49   AST 0 - 40 IU/L 18   19   ALT 0 - 44 IU/L 21   26    Lipid Panel     Component Value Date/Time   CHOL 123 07/25/2023 1340   TRIG 140 07/25/2023 1340   HDL 51 07/25/2023 1340   CHOLHDL 2.4 07/25/2023 1340   LDLCALC 48 07/25/2023 1340   LABVLDL 24 07/25/2023 1340    Anthropometrics   Wt Readings from Last 3 Encounters:  07/08/24 272 lb (123.4 kg)  06/18/24 273 lb 0.6 oz (123.9 kg)  01/25/24 273 lb (123.8 kg)   Ht Readings from Last 3 Encounters:  07/08/24 6' 2 (1.88 m)  06/18/24 6' 2 (1.88 m)  01/25/24 6' 2 (1.88 m)   There is no height or weight on file to calculate BMI. @BMIFA @ Facility age limit for growth %iles is 20 years. Facility age limit for growth %iles is 20 years.   Clinical Medical Hx: See Chart Medications: Metformin  500 mg BID. See chart Labs: A1C 6.9% Notable Signs/Symptoms: none  Lifestyle & Dietary Hx Married. Retired.  Estimated daily fluid: 40 oz Supplements: Vit C, Sleep: Takes ambien  Stress / self-care: none Current average weekly physical activity: ADL  24-Hr Dietary Recall Eating more whole plant based foods Cut out processed meats and processed foods  Estimated Energy Needs  Calories: 1600 Carbohydrate: 180g Protein: 120g Fat: 44g   NUTRITION DIAGNOSIS  NB-1.1 Food and nutrition-related knowledge deficit As related to Diabetes Type 2.  As evidenced by A1C 6.9%.   NUTRITION INTERVENTION  Nutrition education (E-1) on the following topics:  Nutrition and Diabetes education provided on My Plate, CHO counting, meal planning, portion sizes, timing of meals, avoiding snacks between meals unless having a low blood sugar, target ranges for A1C and blood sugars, signs/symptoms and treatment of hyper/hypoglycemia, monitoring blood sugars, taking medications as prescribed, benefits of exercising 30 minutes per day and prevention of complications of DM.  Lifestyle Medicine  - Whole Food, Plant Predominant Nutrition is highly recommended: Eat Plenty of vegetables, Mushrooms, fruits, Legumes, Whole Grains, Nuts, seeds in lieu of processed meats, processed snacks/pastries red meat, poultry, eggs.    -It is better to avoid simple carbohydrates including: Cakes, Sweet Desserts, Ice Cream, Soda (diet and regular), Sweet Tea,  Candies, Chips, Cookies, Store Bought Juices, Alcohol in Excess of  1-2 drinks a day, Lemonade,  Artificial Sweeteners, Doughnuts, Coffee Creamers, Sugar-free Products, etc, etc.  This is not a complete list.....  Exercise: If you are able: 30 -60 minutes a day ,4 days a week, or 150 minutes a week.  The longer the better.  Combine stretch, strength, and aerobic activities.  If you were told in the past that you have high risk for cardiovascular diseases, you may seek evaluation by your heart doctor prior to initiating moderate to intense exercise programs.   Handouts Provided Include  Know your numbers Lifestyle Medicine  Learning Style & Readiness for Change Teaching method utilized: Visual & Auditory  Demonstrated degree of understanding via: Teach Back  Barriers to learning/adherence to lifestyle change: none  Goals Established by Pt Keep up the great job!! Keep working in the garden and eating foods from garden. Watch portion sizes Get as much exercise when you can. Get A1C down to 7% Lose 5 lbs in 3 months. Follow up as needed.  EVALUATION Dietary intake, weekly physical activity, and weight in 4 month.  Recommend to follow up on lipids and see if any other medications for HTN and Hyperlipidemia can be reduced with the lifestyle changes he has made.  Next Steps  Keep up the great job!!

## 2024-07-18 ENCOUNTER — Telehealth: Payer: Self-pay

## 2024-07-18 ENCOUNTER — Encounter: Payer: Self-pay | Admitting: Nutrition

## 2024-07-18 NOTE — Patient Instructions (Signed)
 Keep up the great job!! Keep working in the garden and eating foods from garden. Watch portion sizes Get as much exercise when you can. Get A1C down to 7% Lose 5 lbs in 3 months. Follow up as needed.

## 2024-07-18 NOTE — Telephone Encounter (Signed)
 VOB submitted for Orthovisc, bilateral knee

## 2024-07-22 ENCOUNTER — Other Ambulatory Visit: Payer: Self-pay

## 2024-07-22 DIAGNOSIS — E1165 Type 2 diabetes mellitus with hyperglycemia: Secondary | ICD-10-CM

## 2024-07-22 DIAGNOSIS — E118 Type 2 diabetes mellitus with unspecified complications: Secondary | ICD-10-CM

## 2024-07-26 ENCOUNTER — Ambulatory Visit (INDEPENDENT_AMBULATORY_CARE_PROVIDER_SITE_OTHER)

## 2024-07-26 VITALS — BP 138/70 | HR 71 | Resp 18 | Ht 74.0 in | Wt 272.0 lb

## 2024-07-26 DIAGNOSIS — I10 Essential (primary) hypertension: Secondary | ICD-10-CM

## 2024-07-26 DIAGNOSIS — E118 Type 2 diabetes mellitus with unspecified complications: Secondary | ICD-10-CM | POA: Diagnosis not present

## 2024-07-26 DIAGNOSIS — Z125 Encounter for screening for malignant neoplasm of prostate: Secondary | ICD-10-CM

## 2024-07-26 DIAGNOSIS — R5383 Other fatigue: Secondary | ICD-10-CM | POA: Diagnosis not present

## 2024-07-26 DIAGNOSIS — Z7984 Long term (current) use of oral hypoglycemic drugs: Secondary | ICD-10-CM

## 2024-07-26 DIAGNOSIS — E782 Mixed hyperlipidemia: Secondary | ICD-10-CM

## 2024-07-26 DIAGNOSIS — I739 Peripheral vascular disease, unspecified: Secondary | ICD-10-CM

## 2024-07-26 MED ORDER — METFORMIN HCL 500 MG PO TABS: 500.0000 mg | ORAL_TABLET | Freq: Two times a day (BID) | ORAL | 3 refills | Status: DC

## 2024-07-26 MED ORDER — EZETIMIBE 10 MG PO TABS: 10.0000 mg | ORAL_TABLET | Freq: Every day | ORAL | 3 refills | Status: DC

## 2024-07-26 MED ORDER — CLOPIDOGREL BISULFATE 75 MG PO TABS: 75.0000 mg | ORAL_TABLET | Freq: Every day | ORAL | 3 refills | Status: DC

## 2024-07-26 NOTE — Progress Notes (Signed)
 Established Patient Office Visit  Subjective   Patient ID: Thomas Lindsey, male    DOB: [birth year]  Age: 68 y.o. MRN: [id number]  Chief Complaint  Patient presents with   Medical Management of Chronic Issues    6 month follow up    low energy     Complains of low energy and poor sleep even with ambien      HPI  Discussed the use of AI scribe software for clinical note transcription with the patient, who gave verbal consent to proceed.  History of Present Illness   Thomas Lindsey is a 68 year old male who presents for a six-month follow-up visit.  Upper extremity pain - Arm pain occurs in the evenings and has persisted for at least six to seven months. - Gabapentin  was previously prescribed for arm pain but discontinued due to adverse effects (vision problems, lack of drive, daytime drowsiness). - Continues to experience some arm pain but prefers to manage without medication.  Fatigue and insomnia - Fatigue began during the period of gabapentin  use and persists. - Insomnia occurs approximately three times per year, lasting three nights per episode, followed by return to normal sleep pattern (7-8 hours per night). - Uses CPAP machine nightly, typically until 5 or 6 AM. - Upcoming appointment with pulmonologist for further evaluation. - Has tried multiple sleep aids, including Ambien  (current) and trazodone (discontinued due to 'hung over' feeling and severe drowsiness after accidental extra dose).  Chronic right-sided pain and chest nodules - Chronic pain in right side for 10-12 years, etiology undiagnosed despite prior MRIs and ultrasounds. - Presence of hard spots on chest, believed to be fatty deposits, occasionally causing discomfort.  Diabetes mellitus - History of diabetes mellitus. - Recently accepted a new glucose meter and lifetime supply of strips from [employer] but has not yet started using the new device. - No significant issues with feet, but experiences  sensitivity when scratching the bottom of his foot. - Currently taking metformin .  Cardiovascular findings - History of highly calcified heart identified on previous testing; no further testing or treatment pursued. - Currently taking clopidogrel , spironolactone , and cholesterol medication.      Patient Active Problem List   Diagnosis Date Noted   Bilateral hand numbness 01/25/2024   Insomnia 07/25/2023   Elevated coronary artery calcium  score 07/18/2023   History of cardiovascular calcifications 12/22/2022   Coronary artery disease 10/20/2022   Need for immunization against influenza 08/11/2022   Onychomycosis 08/11/2022   Obesity 06/08/2022   Muscle cramps 06/08/2022   Thumb pain, left 04/27/2022   OSA on CPAP 04/27/2022   Hypertension 02/22/2022   Controlled diabetes mellitus type 2 with complications (HCC) 02/22/2022   PAD (peripheral artery disease) (HCC) 02/22/2022   AAA (abdominal aortic aneurysm) without rupture (HCC) 02/22/2022   Hyperlipidemia 02/22/2022    ROS    Objective:     BP 138/70   Pulse 71   Resp 18   Ht 6' 2 (1.88 m)   Wt 272 lb 0.6 oz (123.4 kg)   SpO2 95%   BMI 34.93 kg/m  BP Readings from Last 3 Encounters:  07/26/24 138/70  07/08/24 119/73  06/18/24 126/64   Wt Readings from Last 3 Encounters:  07/26/24 272 lb 0.6 oz (123.4 kg)  07/16/24 273 lb (123.8 kg)  07/08/24 272 lb (123.4 kg)      Physical Exam Vitals and nursing note reviewed.  Constitutional:      Appearance: Normal appearance. He is obese.  HENT:     Head: Normocephalic.  Eyes:     Extraocular Movements: Extraocular movements intact.     Pupils: Pupils are equal, round, and reactive to light.  Cardiovascular:     Rate and Rhythm: Normal rate and regular rhythm.  Pulmonary:     Effort: Pulmonary effort is normal.     Breath sounds: Normal breath sounds.  Musculoskeletal:     Cervical back: Normal range of motion and neck supple.  Neurological:     Mental Status:  He is alert and oriented to person, place, and time.  Psychiatric:        Mood and Affect: Mood normal.        Thought Content: Thought content normal.    Last CBC Lab Results  Component Value Date   WBC 6.9 07/26/2024   HGB 14.1 07/26/2024   HCT 41.7 07/26/2024   MCV 95 07/26/2024   MCH 32.0 07/26/2024   RDW 12.8 07/26/2024   PLT 233 07/26/2024   Last metabolic panel Lab Results  Component Value Date   GLUCOSE 133 (H) 07/26/2024   NA 135 07/26/2024   K 5.2 07/26/2024   CL 98 07/26/2024   CO2 22 07/26/2024   BUN 18 07/26/2024   CREATININE 0.94 07/26/2024   EGFR 88 07/26/2024   CALCIUM  10.1 07/26/2024   PROT 7.2 07/26/2024   ALBUMIN 4.7 07/26/2024   LABGLOB 2.5 07/26/2024   AGRATIO 1.8 04/27/2022   BILITOT 0.5 07/26/2024   ALKPHOS 55 07/26/2024   AST 17 07/26/2024   ALT 19 07/26/2024   ANIONGAP 9 07/16/2022   Last lipids Lab Results  Component Value Date   CHOL 138 07/26/2024   HDL 61 07/26/2024   LDLCALC 49 07/26/2024   TRIG 171 (H) 07/26/2024   CHOLHDL 2.3 07/26/2024   Last hemoglobin A1c Lab Results  Component Value Date   HGBA1C 6.7 (H) 07/26/2024   Last thyroid functions Lab Results  Component Value Date   TSH 0.759 07/25/2023   Last vitamin D  Lab Results  Component Value Date   VD25OH 37.2 07/25/2023   Last vitamin B12 and Folate Lab Results  Component Value Date   VITAMINB12 643 07/25/2023   FOLATE 17.5 07/25/2023      The 10-year ASCVD risk score (Arnett DK, et al., 2019) is: 27.4%    Assessment & Plan:   Problem List Items Addressed This Visit       Cardiovascular and Mediastinum   Hypertension - Primary (Chronic)   Remains adequately controlled on current antihypertensive regimen.  No medication changes are indicated today.      Relevant Medications   ezetimibe  (ZETIA ) 10 MG tablet   PAD (peripheral artery disease) (HCC) (Chronic)   Remains on DAPT (ASA/Plavix ) and atorvastatin .  Refills provided today.      Relevant  Medications   clopidogrel  (PLAVIX ) 75 MG tablet   ezetimibe  (ZETIA ) 10 MG tablet     Endocrine   Controlled diabetes mellitus type 2 with complications (HCC)   He remains on metformin  500 mg twice daily and has made significant dietary changes in an effort to improve diabetes control.  No medication changes are indicated today.      Relevant Medications   metFORMIN  (GLUCOPHAGE ) 500 MG tablet   Other Relevant Orders   CMP14+EGFR (Completed)   HgB A1c (Completed)     Other   Hyperlipidemia (Chronic)   Lipid panel updated in August 2024 reflects excellent control on atorvastatin , Zetia , and fish oil  supplementation.  Relevant Medications   ezetimibe  (ZETIA ) 10 MG tablet   Other Relevant Orders   CMP14+EGFR (Completed)   Lipid Profile (Completed)   Other Visit Diagnoses       Fatigue, unspecified type       Relevant Orders   CBC with Differential/Platelet (Completed)     Screening for prostate cancer       Relevant Orders   PSA (Completed)     Assessment and Plan    Type 2 diabetes mellitus Metformin  use uncertain. New glucose meter offers lifetime strips and data transmission. - Check A1c level. - Continue metformin  if indicated. - Utilize new glucose meter for monitoring.  Insomnia Intermittent insomnia managed with Ambien . Trazodone not tolerated. - Continue Ambien  as needed. - Over-the-counter options like Unisom discussed but ineffective.  Hyperlipidemia Managed with cholesterol medication.  Obstructive sleep apnea Managed with consistent nightly CPAP use. - Continue CPAP use. - Follow-up with pulmonologist at the end of October.  Fatigue Potentially related to gabapentin , which has been discontinued. - Order blood work to evaluate potential causes.  Peripheral neuropathy Symptoms in feet, with sensations of cutting when itching the bottom of the foot.  Coronary artery calcification Previous calcium  score in the 95th percentile. Managed with  clopidogrel  and cholesterol medication. - Continue clopidogrel  and cholesterol medication.  Chronic right-sided chest/abdominal pain Attributed to muscular causes. Presence of hard spots on the chest, likely fatty deposits, occasionally painful.        Return in about 6 months (around 01/26/2025) for chronic follow-up with PCP.    Leita Longs, FNP

## 2024-07-27 LAB — CMP14+EGFR
ALT: 19 IU/L (ref 0–44)
AST: 17 IU/L (ref 0–40)
Albumin: 4.7 g/dL (ref 3.9–4.9)
Alkaline Phosphatase: 55 IU/L (ref 44–121)
BUN/Creatinine Ratio: 19 (ref 10–24)
BUN: 18 mg/dL (ref 8–27)
Bilirubin Total: 0.5 mg/dL (ref 0.0–1.2)
CO2: 22 mmol/L (ref 20–29)
Calcium: 10.1 mg/dL (ref 8.6–10.2)
Chloride: 98 mmol/L (ref 96–106)
Creatinine, Ser: 0.94 mg/dL (ref 0.76–1.27)
Globulin, Total: 2.5 g/dL (ref 1.5–4.5)
Glucose: 133 mg/dL — ABNORMAL HIGH (ref 70–99)
Potassium: 5.2 mmol/L (ref 3.5–5.2)
Sodium: 135 mmol/L (ref 134–144)
Total Protein: 7.2 g/dL (ref 6.0–8.5)
eGFR: 88 mL/min/1.73 (ref >59)

## 2024-07-27 LAB — HEMOGLOBIN A1C
Est. average glucose Bld gHb Est-mCnc: 146 mg/dL
Hgb A1c MFr Bld: 6.7 % — ABNORMAL HIGH (ref 4.8–5.6)

## 2024-07-27 LAB — CBC WITH DIFFERENTIAL/PLATELET
Basophils Absolute: 0 x10E3/uL (ref 0.0–0.2)
Basos: 0 %
EOS (ABSOLUTE): 0.2 x10E3/uL (ref 0.0–0.4)
Eos: 2 %
Hematocrit: 41.7 % (ref 37.5–51.0)
Hemoglobin: 14.1 g/dL (ref 13.0–17.7)
Immature Grans (Abs): 0 x10E3/uL (ref 0.0–0.1)
Immature Granulocytes: 0 %
Lymphocytes Absolute: 1.7 x10E3/uL (ref 0.7–3.1)
Lymphs: 25 %
MCH: 32 pg (ref 26.6–33.0)
MCHC: 33.8 g/dL (ref 31.5–35.7)
MCV: 95 fL (ref 79–97)
Monocytes Absolute: 0.7 x10E3/uL (ref 0.1–0.9)
Monocytes: 10 %
Neutrophils Absolute: 4.3 x10E3/uL (ref 1.4–7.0)
Neutrophils: 63 %
Platelets: 233 x10E3/uL (ref 150–450)
RBC: 4.4 x10E6/uL (ref 4.14–5.80)
RDW: 12.8 % (ref 11.6–15.4)
WBC: 6.9 x10E3/uL (ref 3.4–10.8)

## 2024-07-27 LAB — LIPID PANEL
Chol/HDL Ratio: 2.3 ratio (ref 0.0–5.0)
Cholesterol, Total: 138 mg/dL (ref 100–199)
HDL: 61 mg/dL (ref >39)
LDL Chol Calc (NIH): 49 mg/dL (ref 0–99)
Triglycerides: 171 mg/dL — ABNORMAL HIGH (ref 0–149)
VLDL Cholesterol Cal: 28 mg/dL (ref 5–40)

## 2024-07-27 LAB — PSA: Prostate Specific Ag, Serum: 2.1 ng/mL (ref 0.0–4.0)

## 2024-07-31 ENCOUNTER — Ambulatory Visit: Payer: Self-pay

## 2024-08-01 NOTE — Assessment & Plan Note (Signed)
 Remains on DAPT (ASA/Plavix) and atorvastatin.  Refills provided today.

## 2024-08-01 NOTE — Assessment & Plan Note (Signed)
 Lipid panel updated in August 2024 reflects excellent control on atorvastatin, Zetia, and fish oil supplementation.

## 2024-08-01 NOTE — Assessment & Plan Note (Signed)
 Remains adequately controlled on current antihypertensive regimen.  No medication changes are indicated today.

## 2024-08-01 NOTE — Assessment & Plan Note (Signed)
 He remains on metformin  500 mg twice daily and has made significant dietary changes in an effort to improve diabetes control.  No medication changes are indicated today.

## 2024-08-05 DIAGNOSIS — E119 Type 2 diabetes mellitus without complications: Secondary | ICD-10-CM | POA: Diagnosis not present

## 2024-08-23 ENCOUNTER — Ambulatory Visit: Admitting: Orthopedic Surgery

## 2024-08-30 ENCOUNTER — Ambulatory Visit: Admitting: Orthopedic Surgery

## 2024-09-04 DIAGNOSIS — E119 Type 2 diabetes mellitus without complications: Secondary | ICD-10-CM | POA: Diagnosis not present

## 2024-09-06 ENCOUNTER — Ambulatory Visit (INDEPENDENT_AMBULATORY_CARE_PROVIDER_SITE_OTHER): Admitting: Orthopedic Surgery

## 2024-09-06 DIAGNOSIS — M1711 Unilateral primary osteoarthritis, right knee: Secondary | ICD-10-CM

## 2024-09-06 DIAGNOSIS — M170 Bilateral primary osteoarthritis of knee: Secondary | ICD-10-CM

## 2024-09-06 DIAGNOSIS — M1712 Unilateral primary osteoarthritis, left knee: Secondary | ICD-10-CM

## 2024-09-06 MED ORDER — HYALURONAN 30 MG/2ML IX SOSY
30.0000 mg | PREFILLED_SYRINGE | INTRA_ARTICULAR | Status: AC
Start: 2024-09-06 — End: 2024-09-27
  Administered 2024-09-06 – 2024-09-20 (×3): 30 mg via INTRA_ARTICULAR

## 2024-09-06 MED ORDER — HYALURONAN 30 MG/2ML IX SOSY
30.0000 mg | PREFILLED_SYRINGE | INTRA_ARTICULAR | Status: AC
  Administered 2024-09-06 – 2024-09-20 (×3): 30 mg via INTRA_ARTICULAR

## 2024-09-06 NOTE — Progress Notes (Signed)
 Patient: Thomas Lindsey           Date of Birth: 1956/03/26           MRN: [id number] Visit Date: 09/06/2024 Requested by: Bevely Doffing, FNP [phone] [place],  [place] [address] PCP: Bevely Doffing, FNP  Chief Complaint  Patient presents with   Injections    Both knees orthovisc 1    Encounter Diagnoses  Name Primary?   Primary osteoarthritis of right knee Yes   Primary osteoarthritis of left knee     The patient has consented to and requested hyaluronic acid injection   MEDICATION: orthovisc  bilaterally  THE knee is prepped with alcohol and ethyl chloride  The injection is performed with a 21-gauge needle, via inferolateral approach  No complications were noted  Appropriate instructions post injection were given  RTC 1 WEEK

## 2024-09-11 NOTE — Progress Notes (Signed)
 Thomas Lindsey                                          MRN: [id number]   09/11/2024   The [facility] Quality Team Specialist reviewed this patient medical record for the purposes of chart review for care gap closure. The following were reviewed: chart review for care gap closure-kidney health evaluation for diabetes:eGFR  and uACR.    [facility]I Quality Team

## 2024-09-13 ENCOUNTER — Ambulatory Visit: Admitting: Orthopedic Surgery

## 2024-09-13 DIAGNOSIS — M170 Bilateral primary osteoarthritis of knee: Secondary | ICD-10-CM

## 2024-09-13 DIAGNOSIS — M1712 Unilateral primary osteoarthritis, left knee: Secondary | ICD-10-CM

## 2024-09-13 DIAGNOSIS — M1711 Unilateral primary osteoarthritis, right knee: Secondary | ICD-10-CM

## 2024-09-13 NOTE — Progress Notes (Signed)
 Chief Complaint  Patient presents with   Injections    Orthovisc #2 bilateral knee    Patient: Thomas Lindsey           Date of Birth: [birth year]           MRN: [id number] Visit Date: 09/13/2024 Requested by: Bevely Doffing, FNP [address] [place],  [place] [address] PCP: Bevely Doffing, FNP  Chief Complaint  Patient presents with   Injections    Orthovisc #2 bilateral knee    Encounter Diagnoses  Name Primary?   Primary osteoarthritis of right knee Yes   Primary osteoarthritis of left knee     The patient has consented to and requested hyaluronic acid injection   MEDICATION: Orthovisc 2 of 3  left  THE knee is prepped with alcohol and ethyl chloride  The injection is performed with a 21-gauge needle, via inferolateral approach  No complications were noted  Appropriate instructions post injection were given    Procedure note for injection of hyaluronic acid   Diagnosis osteoarthritis of the knee  Verbal consent was obtained to inject the knee with HYALURONIC ACID . Timeout was completed to confirm the injection site as the right   knee  Ethyl chloride spray was used for anesthesia Alcohol was used to prep the skin. The infrapatellar lateral portal was used as an injection site and 1 vial of hyaluronic acid  was injected into the knee  Specific Co. Preparation: Orthovisc  No complications were noted

## 2024-09-20 ENCOUNTER — Encounter: Payer: Self-pay | Admitting: Orthopedic Surgery

## 2024-09-20 ENCOUNTER — Ambulatory Visit: Admitting: Orthopedic Surgery

## 2024-09-20 DIAGNOSIS — M1711 Unilateral primary osteoarthritis, right knee: Secondary | ICD-10-CM

## 2024-09-20 DIAGNOSIS — M1712 Unilateral primary osteoarthritis, left knee: Secondary | ICD-10-CM

## 2024-09-20 DIAGNOSIS — M170 Bilateral primary osteoarthritis of knee: Secondary | ICD-10-CM | POA: Diagnosis not present

## 2024-09-20 NOTE — Progress Notes (Signed)
 Patient: Thomas Lindsey           Date of Birth: [birth year]           MRN: [id number] Visit Date: 09/20/2024 Requested by: Bevely Doffing, FNP [phone] [place],  [place] [address] PCP: Bevely Doffing, FNP  Chief Complaint  Patient presents with   Injections    Both knees orthovisc     Encounter Diagnoses  Name Primary?   Primary osteoarthritis of right knee Yes   Primary osteoarthritis of left knee     The patient has consented to and requested hyaluronic acid injection   MEDICATION: orthovisc   bilaterally  THE knee is prepped with alcohol and ethyl chloride  The injection is performed with a 21-gauge needle, via inferolateral approach  No complications were noted  Appropriate instructions post injection were given  This was repeated in the other knee   Return prn

## 2024-09-20 NOTE — Patient Instructions (Signed)
 Instructions  Have a good weekend The meds we injected should be most beneficial at 6 weeks after this injection  Thanks for the feedback about our instructions.

## 2024-09-27 ENCOUNTER — Telehealth: Payer: Self-pay

## 2024-09-27 NOTE — Telephone Encounter (Signed)
 Called patient to schedule recall w/Dr. Mallipeddi. The patient stated that he does not want to follow up with our office. He stated that he was not impressed w/the doctor and he felt like the whole thing was a business transaction instead of healthcare. I asked the patient if he would like to schedule w/one of our APP's and he stated no they work with the doctor, I will go elsewhere. Deleting recall.

## 2024-09-30 ENCOUNTER — Telehealth: Payer: Self-pay | Admitting: Internal Medicine

## 2024-09-30 NOTE — Telephone Encounter (Signed)
 Pt requesting a provider switch from Dr. Mallipeddi to Dr. Alvan.

## 2024-10-04 ENCOUNTER — Ambulatory Visit: Admitting: Primary Care

## 2024-10-04 ENCOUNTER — Encounter: Payer: Self-pay | Admitting: Primary Care

## 2024-10-04 VITALS — BP 136/74 | HR 70 | Temp 97.4°F | Ht 74.0 in | Wt 276.4 lb

## 2024-10-04 DIAGNOSIS — Z23 Encounter for immunization: Secondary | ICD-10-CM | POA: Diagnosis not present

## 2024-10-04 DIAGNOSIS — G4700 Insomnia, unspecified: Secondary | ICD-10-CM | POA: Diagnosis not present

## 2024-10-04 DIAGNOSIS — G4733 Obstructive sleep apnea (adult) (pediatric): Secondary | ICD-10-CM | POA: Diagnosis not present

## 2024-10-04 DIAGNOSIS — Z0000 Encounter for general adult medical examination without abnormal findings: Secondary | ICD-10-CM

## 2024-10-04 MED ORDER — ZOLPIDEM TARTRATE ER 12.5 MG PO TBCR: 12.5000 mg | EXTENDED_RELEASE_TABLET | Freq: Every evening | ORAL | 0 refills | Status: DC | PRN

## 2024-10-04 NOTE — Progress Notes (Signed)
 @Patient  ID: Thomas Lindsey, male    DOB: [birth year], 68 y.o.   MRN: [id number]  Chief Complaint  Patient presents with   Obstructive Sleep Apnea    Pt states he takes Ambien  to help. Pt states some nights he wakes up through out the night     Referring provider: Bevely Doffing, FNP  HPI: 68 year old male, former smoker. PMH significant for HTN, diabetes, CAD, OSA on CPAP, type 2 diabetes, hyperlipidemia, obesity.   10/04/2024 Discussed the use of AI scribe software for clinical note transcription with the patient, who gave verbal consent to proceed.  History of Present Illness Thomas Lindsey is a 68 year old male with mild sleep apnea who presents for a one-year follow-up.  He has a history of mild sleep apnea with an apnea-hypopnea index of 13 events per hour from a home sleep study in June 2023. He has been using a CPAP machine for over four years, initially prescribed before the 2023 sleep study. CPAP therapy initially improved his sleep significantly, allowing him to sleep up to nine hours a night, compared to three hours before treatment. However, he now experiences difficulty maintaining sleep, often waking up at 3 or 4 AM and finding it hard to fall back asleep.  Medicare has denied coverage for CPAP supplies, leading him to pay out of pocket for these items. He prefers to obtain his supplies from [facility] due to their reasonable pricing and reliable service.  He is taking zolpidem  10 mg for sleep, which he finds necessary to maintain his sleep. He wakes up in the early morning hours and has been resistant to attempts to discontinue the medication. He is interested in the extended-release formulation to help with sleep maintenance.  He moved from [place]  to his current location in 2019 and has a background in the [employer], having been stationed in [place] . He has a history of vascular issues, having had stents placed in his legs in the past.      Allergies  Allergen  Reactions   Hctz [Hydrochlorothiazide] Other (See Comments)    Dizzy    Immunization History  Administered Date(s) Administered   Fluad Quad(high Dose 65+) 08/11/2022   Fluad Trivalent(High Dose 65+) 01/25/2024   INFLUENZA, HIGH DOSE SEASONAL PF 10/04/2024   Influenza-Unspecified 09/13/2021   Moderna Covid-19 Vaccine Bivalent Booster 56yrs & up 06/24/2021, 09/13/2021   Moderna SARS-COV2 Booster Vaccination 10/20/2020   PFIZER Comirnaty(Gray Top)Covid-19 Tri-Sucrose Vaccine 02/07/2020, 02/28/2020   PNEUMOCOCCAL CONJUGATE-20 10/04/2024    Past Medical History:  Diagnosis Date   AAA (abdominal aortic aneurysm) 2016   Aortic disorder    Arthritis 2018   Cervical radiculitis    Depression    Diabetes mellitus without complication (HCC) 2022   Elevated glucose    GERD (gastroesophageal reflux disease) ?   Hypertension 2016   Insomnia    Obstructive sleep apnea    Peripheral arterial disease    Saccular aneurysm    Sleep apnea 2019   Tinnitus of both ears     Tobacco History: Social History   Tobacco Use  Smoking Status Former   Current packs/day: 0.00   Average packs/day: 1 pack/day for 15.0 years (15.0 ttl pk-yrs)   Types: Cigarettes, Cigars   Quit date: 06/14/2007   Years since quitting: 17.3  Smokeless Tobacco Never   Counseling given: Not Answered   Outpatient Medications Prior to Visit  Medication Sig Dispense Refill   amLODipine  (NORVASC ) 10 MG tablet TAKE 1 TABLET  DAILY 90 tablet 3   ascorbic acid (VITAMIN C ) 500 MG tablet Take 1 tablet (500 mg total) by mouth daily. 90 tablet 3   atorvastatin  (LIPITOR) 80 MG tablet Take 1 tablet (80 mg total) by mouth daily. 90 tablet 3   clopidogrel  (PLAVIX ) 75 MG tablet Take 1 tablet (75 mg total) by mouth daily. 90 tablet 3   cyclobenzaprine  (FLEXERIL ) 5 MG tablet TAKE 1 TABLET TWICE A DAY AS NEEDED FOR MUSCLE SPASMS 30 tablet 23   diclofenac  Sodium (VOLTAREN ) 1 % GEL APPLY 2 GRAMS TOPICALLY DAILY AS NEEDED FOR PAIN 100 g  6   ezetimibe  (ZETIA ) 10 MG tablet Take 1 tablet (10 mg total) by mouth daily. 90 tablet 3   glucose blood (ACCU-CHEK GUIDE) test strip Check blood sugar once daily. 100 each 2   lisinopril  (ZESTRIL ) 5 MG tablet Take 1 tablet (5 mg total) by mouth daily. 90 tablet 3   meloxicam  (MOBIC ) 15 MG tablet Take 15 mg by mouth daily.     metFORMIN  (GLUCOPHAGE ) 500 MG tablet Take 1 tablet (500 mg total) by mouth 2 (two) times daily with a meal. 180 tablet 3   metoprolol  succinate (TOPROL -XL) 100 MG 24 hr tablet Take 1 tablet (100 mg total) by mouth daily. Take with or immediately following a meal. 90 tablet 3   Omega-3 Fatty Acids (FISH OIL ) 1000 MG CAPS Take 1 capsule (1,000 mg total) by mouth daily. 90 capsule 3   pantoprazole  (PROTONIX ) 40 MG tablet Take 1 tablet (40 mg total) by mouth daily. 90 tablet 3   spironolactone  (ALDACTONE ) 50 MG tablet Take 1 tablet (50 mg total) by mouth 2 (two) times daily. 180 tablet 3   zolpidem  (AMBIEN ) 10 MG tablet TAKE 1 TABLET AT BEDTIME 90 tablet 0   No facility-administered medications prior to visit.   Review of Systems  Review of Systems  Constitutional: Negative.   Respiratory: Negative.    Cardiovascular: Negative.    Physical Exam  BP 136/74   Pulse 70   Temp (!) 97.4 F (36.3 C)   Ht 6' 2 (1.88 m)   Wt 276 lb 6.4 oz (125.4 kg)   SpO2 98% Comment: ra  BMI 35.49 kg/m  Physical Exam Constitutional:      Appearance: Normal appearance. He is well-developed.  HENT:     Head: Normocephalic and atraumatic.     Mouth/Throat:     Mouth: Mucous membranes are moist.     Pharynx: Oropharynx is clear.  Cardiovascular:     Rate and Rhythm: Normal rate and regular rhythm.     Heart sounds: Normal heart sounds.  Pulmonary:     Effort: Pulmonary effort is normal. No respiratory distress.     Breath sounds: Normal breath sounds. No wheezing or rhonchi.  Musculoskeletal:        General: Normal range of motion.     Cervical back: Normal range of motion  and neck supple.  Skin:    General: Skin is warm and dry.     Findings: No erythema or rash.  Neurological:     General: No focal deficit present.     Mental Status: He is alert and oriented to person, place, and time. Mental status is at baseline.  Psychiatric:        Mood and Affect: Mood normal.        Behavior: Behavior normal.        Thought Content: Thought content normal.  Judgment: Judgment normal.     Lab Results:  CBC    Component Value Date/Time   WBC 6.9 07/26/2024 0925   WBC 12.5 (H) 07/16/2022 2235   RBC 4.40 07/26/2024 0925   RBC 4.53 07/16/2022 2235   HGB 14.1 07/26/2024 0925   HCT 41.7 07/26/2024 0925   PLT 233 07/26/2024 0925   MCV 95 07/26/2024 0925   MCH 32.0 07/26/2024 0925   MCH 30.2 07/16/2022 2235   MCHC 33.8 07/26/2024 0925   MCHC 33.9 07/16/2022 2235   RDW 12.8 07/26/2024 0925   LYMPHSABS 1.7 07/26/2024 0925   MONOABS 0.7 07/16/2022 2235   EOSABS 0.2 07/26/2024 0925   BASOSABS 0.0 07/26/2024 0925    BMET    Component Value Date/Time   NA 135 07/26/2024 0925   K 5.2 07/26/2024 0925   CL 98 07/26/2024 0925   CO2 22 07/26/2024 0925   GLUCOSE 133 (H) 07/26/2024 0925   GLUCOSE 233 (H) 07/16/2022 2235   BUN 18 07/26/2024 0925   CREATININE 0.94 07/26/2024 0925   CALCIUM  10.1 07/26/2024 0925   GFRNONAA >60 07/16/2022 2235    BNP No results found for: BNP  ProBNP No results found for: PROBNP  Imaging: No results found.   Assessment & Plan:   1. OSA on CPAP (Primary)  Assessment and Plan Assessment & Plan Obstructive sleep apnea Mild obstructive sleep apnea with an apnea-hypopnea index (AHI) of 13 events per hour from a home sleep study in June 2023. Currently on CPAP therapy for over two years with an AHI of 4.2 events per hour, indicating well-controlled sleep apnea. However, there are instances where the CPAP machine reaches its maximum pressure of 10 cm H2O, suggesting some residual apneic events. He reports  difficulty with sleep maintenance, waking up at 3-4 AM and having trouble returning to sleep. - Renewed CPAP supplies for one year. - Increased maximum CPAP pressure from 10 cm H2O to 12 cm H2O.  Insomnia Chronic insomnia with difficulty maintaining sleep, characterized by waking up at 3-4 AM and trouble returning to sleep. Currently on zolpidem  10 mg, which is not effectively maintaining sleep throughout the night. He expresses interest in trying extended-release zolpidem  to improve sleep maintenance. - Discontinued zolpidem  10 mg. - Prescribed zolpidem  extended-release 12.5 mg.  Health maintenance - Administered pneumonia and flu vaccines.    Thomas LELON Ferrari, NP 10/04/2024

## 2024-10-04 NOTE — Patient Instructions (Signed)
  VISIT SUMMARY: Today, we discussed your mild sleep apnea and chronic insomnia during your one-year follow-up visit. You have been using a CPAP machine for over four years, which initially improved your sleep, but you are now experiencing difficulty maintaining sleep. We also addressed your current use of zolpidem  for sleep and your interest in an extended-release formulation.  YOUR PLAN: -OBSTRUCTIVE SLEEP APNEA: Obstructive sleep apnea is a condition where your airway becomes blocked during sleep, causing breathing interruptions. Your sleep apnea is well-controlled with CPAP therapy, but you are experiencing some residual events. We have renewed your CPAP supplies for one year and increased the maximum pressure from 10 cm H2O to 12 cm H2O to help reduce these events. You also received pneumonia and flu vaccines today.  -INSOMNIA: Insomnia is a condition where you have trouble falling or staying asleep. You have been waking up early and having difficulty returning to sleep. We have discontinued your current zolpidem  10 mg and prescribed zolpidem  extended-release 12.5 mg to help you maintain sleep throughout the night.  INSTRUCTIONS: Please follow up in one year for your next appointment. If you experience any issues with your new medication or CPAP settings, contact our office for further assistance.  Follow-up 1 year with [facility] NP or sooner if needed

## 2024-10-05 DIAGNOSIS — E119 Type 2 diabetes mellitus without complications: Secondary | ICD-10-CM | POA: Diagnosis not present

## 2024-10-07 ENCOUNTER — Telehealth: Payer: Self-pay

## 2024-10-07 ENCOUNTER — Encounter: Payer: Self-pay | Admitting: Radiology

## 2024-10-09 NOTE — Telephone Encounter (Signed)
 Pharmacy team, Patient states that the Ambien  CR is not covered by his insurance.  Could you please find out what is on the preferred list.  Thank you.

## 2024-10-10 ENCOUNTER — Telehealth: Payer: Self-pay

## 2024-10-10 ENCOUNTER — Other Ambulatory Visit: Payer: Self-pay

## 2024-10-10 ENCOUNTER — Other Ambulatory Visit: Payer: Self-pay | Admitting: Internal Medicine

## 2024-10-10 DIAGNOSIS — G4700 Insomnia, unspecified: Secondary | ICD-10-CM

## 2024-10-10 DIAGNOSIS — I10 Essential (primary) hypertension: Secondary | ICD-10-CM

## 2024-10-10 DIAGNOSIS — E118 Type 2 diabetes mellitus with unspecified complications: Secondary | ICD-10-CM

## 2024-10-10 NOTE — Telephone Encounter (Signed)
*  Pulm  Pharmacy Patient Advocate Encounter   Received notification from RX Request Messages that prior authorization for Zolpidem  Tartrate ER 12.5MG  er tablets   is required/requested.   Insurance verification completed.   The patient is insured through [facility].   Per test claim: PA required; PA submitted to above mentioned insurance via Latent Key/confirmation #/EOC [id number] Status is pending

## 2024-10-10 NOTE — Telephone Encounter (Signed)
 Your request has been approved Approved. Authorization Expiration11/05/2025

## 2024-10-15 MED ORDER — ZOLPIDEM TARTRATE ER 12.5 MG PO TBCR: 12.5000 mg | EXTENDED_RELEASE_TABLET | Freq: Every evening | ORAL | 0 refills | Status: DC | PRN

## 2024-10-15 NOTE — Telephone Encounter (Signed)
 I sent on 10/31, [facility]

## 2024-10-29 DIAGNOSIS — H04123 Dry eye syndrome of bilateral lacrimal glands: Secondary | ICD-10-CM | POA: Diagnosis not present

## 2024-10-29 DIAGNOSIS — E119 Type 2 diabetes mellitus without complications: Secondary | ICD-10-CM | POA: Diagnosis not present

## 2024-10-29 DIAGNOSIS — H40023 Open angle with borderline findings, high risk, bilateral: Secondary | ICD-10-CM | POA: Diagnosis not present

## 2024-11-07 NOTE — Telephone Encounter (Signed)
 NFN

## 2024-11-18 ENCOUNTER — Other Ambulatory Visit: Payer: Self-pay | Admitting: Primary Care

## 2024-12-20 ENCOUNTER — Other Ambulatory Visit: Payer: Self-pay | Admitting: Primary Care

## 2024-12-24 ENCOUNTER — Other Ambulatory Visit: Payer: Self-pay

## 2024-12-24 ENCOUNTER — Telehealth: Payer: Self-pay

## 2024-12-24 DIAGNOSIS — E782 Mixed hyperlipidemia: Secondary | ICD-10-CM

## 2024-12-24 DIAGNOSIS — I739 Peripheral vascular disease, unspecified: Secondary | ICD-10-CM

## 2024-12-24 DIAGNOSIS — E118 Type 2 diabetes mellitus with unspecified complications: Secondary | ICD-10-CM

## 2024-12-24 DIAGNOSIS — I10 Essential (primary) hypertension: Secondary | ICD-10-CM

## 2024-12-24 MED ORDER — METOPROLOL SUCCINATE ER 100 MG PO TB24: 100.0000 mg | ORAL_TABLET | Freq: Every day | ORAL | 3 refills | Status: AC

## 2024-12-24 MED ORDER — SPIRONOLACTONE 50 MG PO TABS: 50.0000 mg | ORAL_TABLET | Freq: Two times a day (BID) | ORAL | 3 refills | Status: AC

## 2024-12-24 MED ORDER — EZETIMIBE 10 MG PO TABS: 10.0000 mg | ORAL_TABLET | Freq: Every day | ORAL | 3 refills | Status: AC

## 2024-12-24 MED ORDER — ATORVASTATIN CALCIUM 80 MG PO TABS: 80.0000 mg | ORAL_TABLET | Freq: Every day | ORAL | 3 refills | Status: AC

## 2024-12-24 MED ORDER — PANTOPRAZOLE SODIUM 40 MG PO TBEC: 40.0000 mg | DELAYED_RELEASE_TABLET | Freq: Every day | ORAL | 3 refills | Status: AC

## 2024-12-24 MED ORDER — METFORMIN HCL 500 MG PO TABS: 500.0000 mg | ORAL_TABLET | Freq: Two times a day (BID) | ORAL | 3 refills | Status: AC

## 2024-12-24 MED ORDER — AMLODIPINE BESYLATE 10 MG PO TABS: 10.0000 mg | ORAL_TABLET | Freq: Every day | ORAL | 3 refills | Status: AC

## 2024-12-24 MED ORDER — LISINOPRIL 5 MG PO TABS: 5.0000 mg | ORAL_TABLET | Freq: Every day | ORAL | 3 refills | Status: AC

## 2024-12-24 MED ORDER — CLOPIDOGREL BISULFATE 75 MG PO TABS: 75.0000 mg | ORAL_TABLET | Freq: Every day | ORAL | 3 refills | Status: AC

## 2024-12-24 NOTE — Telephone Encounter (Signed)
 Copied from CRM [id number]. Topic: Clinical - Medication Question >> Dec 24, 2024  2:33 PM Wess RAMAN wrote: Reason for CRM: Patient needs his list of prescription sent to [facility] via e-scribe so that they may refill them.  e-scribe: [phone] Fax #: [phone] Prescriber Line: [phone]

## 2024-12-24 NOTE — Telephone Encounter (Signed)
 Rx sent to pharmacy

## 2025-01-24 ENCOUNTER — Ambulatory Visit

## 2025-06-23 ENCOUNTER — Ambulatory Visit
